# Patient Record
Sex: Female | Born: 1975 | Race: White | Hispanic: No | Marital: Married | Smoking: Former smoker
Health system: Southern US, Community
[De-identification: ages and names within clinical notes are randomized; demographics above are authoritative.]

## PROBLEM LIST (undated history)

## (undated) DIAGNOSIS — G43909 Migraine, unspecified, not intractable, without status migrainosus: Secondary | ICD-10-CM

## (undated) DIAGNOSIS — E039 Hypothyroidism, unspecified: Secondary | ICD-10-CM

## (undated) HISTORY — DX: Migraine, unspecified, not intractable, without status migrainosus: G43.909

## (undated) HISTORY — DX: Hypothyroidism, unspecified: E03.9

## (undated) HISTORY — PX: MANDIBLE SURGERY: SHX707

---

## 2002-08-23 ENCOUNTER — Other Ambulatory Visit: Admission: RE | Admit: 2002-08-23 | Discharge: 2002-08-23 | Payer: Self-pay | Admitting: Family Medicine

## 2004-01-09 ENCOUNTER — Other Ambulatory Visit: Admission: RE | Admit: 2004-01-09 | Discharge: 2004-01-09 | Payer: Self-pay | Admitting: Family Medicine

## 2005-02-14 ENCOUNTER — Other Ambulatory Visit: Admission: RE | Admit: 2005-02-14 | Discharge: 2005-02-14 | Payer: Self-pay | Admitting: Obstetrics and Gynecology

## 2005-04-24 LAB — HM MAMMOGRAPHY: HM MAMMO: NORMAL

## 2006-02-18 ENCOUNTER — Other Ambulatory Visit: Admission: RE | Admit: 2006-02-18 | Discharge: 2006-02-18 | Payer: Self-pay | Admitting: Obstetrics and Gynecology

## 2006-12-24 ENCOUNTER — Encounter: Admission: RE | Admit: 2006-12-24 | Discharge: 2007-02-09 | Payer: Self-pay | Admitting: Dentistry

## 2007-07-15 ENCOUNTER — Encounter: Payer: Self-pay | Admitting: Endocrinology

## 2007-08-12 ENCOUNTER — Other Ambulatory Visit: Admission: RE | Admit: 2007-08-12 | Discharge: 2007-08-12 | Payer: Self-pay | Admitting: Obstetrics and Gynecology

## 2007-08-25 ENCOUNTER — Ambulatory Visit (HOSPITAL_COMMUNITY): Admission: RE | Admit: 2007-08-25 | Discharge: 2007-08-25 | Payer: Self-pay | Admitting: Obstetrics and Gynecology

## 2007-09-11 ENCOUNTER — Ambulatory Visit (HOSPITAL_COMMUNITY): Admission: RE | Admit: 2007-09-11 | Discharge: 2007-09-11 | Payer: Self-pay | Admitting: Obstetrics and Gynecology

## 2007-10-05 ENCOUNTER — Ambulatory Visit (HOSPITAL_COMMUNITY): Admission: RE | Admit: 2007-10-05 | Discharge: 2007-10-05 | Payer: Self-pay | Admitting: Obstetrics and Gynecology

## 2007-10-20 ENCOUNTER — Ambulatory Visit (HOSPITAL_COMMUNITY): Admission: RE | Admit: 2007-10-20 | Discharge: 2007-10-20 | Payer: Self-pay | Admitting: Obstetrics and Gynecology

## 2008-02-19 ENCOUNTER — Inpatient Hospital Stay (HOSPITAL_COMMUNITY): Admission: AD | Admit: 2008-02-19 | Discharge: 2008-02-19 | Payer: Self-pay | Admitting: Obstetrics & Gynecology

## 2008-02-21 ENCOUNTER — Inpatient Hospital Stay (HOSPITAL_COMMUNITY): Admission: AD | Admit: 2008-02-21 | Discharge: 2008-02-23 | Payer: Self-pay | Admitting: Obstetrics and Gynecology

## 2008-10-07 LAB — HM MAMMOGRAPHY: HM MAMMO: NORMAL

## 2009-03-24 LAB — CONVERTED CEMR LAB

## 2009-03-29 ENCOUNTER — Other Ambulatory Visit: Admission: RE | Admit: 2009-03-29 | Discharge: 2009-03-29 | Payer: Self-pay | Admitting: Obstetrics and Gynecology

## 2009-03-29 ENCOUNTER — Encounter: Payer: Self-pay | Admitting: Endocrinology

## 2009-05-10 ENCOUNTER — Encounter: Payer: Self-pay | Admitting: Endocrinology

## 2009-06-06 ENCOUNTER — Ambulatory Visit: Payer: Self-pay | Admitting: Endocrinology

## 2009-06-06 DIAGNOSIS — E039 Hypothyroidism, unspecified: Secondary | ICD-10-CM | POA: Insufficient documentation

## 2009-06-06 DIAGNOSIS — E7800 Pure hypercholesterolemia, unspecified: Secondary | ICD-10-CM | POA: Insufficient documentation

## 2009-07-19 ENCOUNTER — Telehealth: Payer: Self-pay | Admitting: Endocrinology

## 2009-07-20 ENCOUNTER — Ambulatory Visit: Payer: Self-pay | Admitting: Endocrinology

## 2009-07-21 LAB — CONVERTED CEMR LAB
Cholesterol: 204 mg/dL — ABNORMAL HIGH (ref 0–200)
TSH: 6.39 microintl units/mL — ABNORMAL HIGH (ref 0.35–5.50)
Total CHOL/HDL Ratio: 2

## 2009-10-16 ENCOUNTER — Ambulatory Visit: Payer: Self-pay | Admitting: Endocrinology

## 2010-07-04 ENCOUNTER — Other Ambulatory Visit: Payer: Self-pay | Admitting: Endocrinology

## 2010-07-04 ENCOUNTER — Ambulatory Visit
Admission: RE | Admit: 2010-07-04 | Discharge: 2010-07-04 | Payer: Self-pay | Source: Home / Self Care | Attending: Endocrinology | Admitting: Endocrinology

## 2010-07-04 LAB — TSH: TSH: 1.78 u[IU]/mL (ref 0.35–5.50)

## 2010-07-24 NOTE — Progress Notes (Signed)
Summary: Lab request  Phone Note Call from Patient Call back at Home Phone [phone]   Caller: Patient Call For: Minus Breeding MD Reason for Call: Talk to Doctor Summary of Call: Patient came into the office requesting another lab to recheck her cholesterol. She said it was high last time, and would like to have it rechecked for peace of mind. Initial call taken by: Irma Newness,  July 19, 2009 11:43 AM  Follow-up for Phone Call        tsh 244.9 lipids 272.0 Follow-up by: Minus Breeding MD,  July 19, 2009 2:40 PM  Additional Follow-up for Phone Call Additional follow up Details #1::        I put labs in IDX. Tried to inform pt but phone just rang. Will try in the morning again. Additional Follow-up by: Josph Macho CMA,  July 19, 2009 3:17 PM    Additional Follow-up for Phone Call Additional follow up Details #2::    pt informed Follow-up by: Margaret Pyle, CMA,  July 19, 2009 3:53 PM

## 2010-07-26 NOTE — Assessment & Plan Note (Signed)
Summary: PER DAHLIA SCHED   STC   Vital Signs:  Patient profile:   35 year old female Height:      65 inches (165.10 cm) Weight:      125.50 pounds (57.05 kg) BMI:     20.96 O2 Sat:      99 % on Room air Temp:     98.9 degrees F (37.17 degrees C) oral Pulse rate:   88 / minute Pulse rhythm:   regular BP sitting:   106 / 78  (left arm) Cuff size:   regular  Vitals Entered By: Brenton Grills CMA Duncan Dull) (July 04, 2010 10:57 AM)  O2 Flow:  Room air CC: Follow on thyroid/refill on meds/aj Is Patient Diabetic? No Comments Pt is not currently taking Birth Control   Referring Provider:  Dallas Schimke Primary Provider:  r neal  CC:  Follow on thyroid/refill on meds/aj.  History of Present Illness: pt has h/o mild primary hypothyroidism.  pt states she feels well in general.  she says she and her husband are now considering a pregnancy.    Current Medications (verified): 1)  Birth Control 2)  Levothyroxine Sodium 50 Mcg Tabs (Levothyroxine Sodium) .Marland Kitchen.. 1 Qd  Allergies (verified): No Known Drug Allergies  Past History:  Past Medical History: HYPERCHOLESTEROLEMIA (ICD-272.0) UNSPECIFIED HYPOTHYROIDISM (ICD-244.9)  Review of Systems  The patient denies weight loss and weight gain.    Physical Exam  General:  normal appearance.   Neck:  thyroid is not enlarged.  it has a slightly irregular texture.   Impression & Recommendations:  Problem # 1:  UNSPECIFIED HYPOTHYROIDISM (ICD-244.9) well-replaced  Other Orders: TLB-TSH (Thyroid Stimulating Hormone) (84443-TSH) TLB-TSH (Thyroid Stimulating Hormone) (84443-TSH) Est. Patient Level III (16109)  Patient Instructions: 1)  cc dr neal 2)  if normal, i would advise recheck in 6 mos, then annually.  tsh should also be followed if pregnancy is diagnosed or becomes possible.  i would be happy to do this, or dr neal could do so.   3)  (update: i left message on phone-tree:  rx as we discussed) Prescriptions: LEVOTHYROXINE  SODIUM 50 MCG TABS (LEVOTHYROXINE SODIUM) 1 qd  #90 x 2   Entered and Authorized by:   Minus Breeding MD   Signed by:   Minus Breeding MD on 07/04/2010   Method used:   Electronically to        [facility] (retail)       [address]       [place], [place]  [address]       Ph: [phone]       Fax: [phone]   RxID:   [id number]    Orders Added: 1)  TLB-TSH (Thyroid Stimulating Hormone) [84443-TSH] 2)  TLB-TSH (Thyroid Stimulating Hormone) [84443-TSH] 3)  Est. Patient Level III [44010]

## 2011-01-09 ENCOUNTER — Encounter: Payer: Self-pay | Admitting: Internal Medicine

## 2011-01-09 ENCOUNTER — Ambulatory Visit (INDEPENDENT_AMBULATORY_CARE_PROVIDER_SITE_OTHER): Payer: BC Managed Care – PPO | Admitting: Internal Medicine

## 2011-01-09 DIAGNOSIS — E079 Disorder of thyroid, unspecified: Secondary | ICD-10-CM

## 2011-01-09 DIAGNOSIS — E785 Hyperlipidemia, unspecified: Secondary | ICD-10-CM

## 2011-01-09 DIAGNOSIS — R002 Palpitations: Secondary | ICD-10-CM

## 2011-01-09 DIAGNOSIS — E7800 Pure hypercholesterolemia, unspecified: Secondary | ICD-10-CM

## 2011-01-09 DIAGNOSIS — R079 Chest pain, unspecified: Secondary | ICD-10-CM

## 2011-01-09 NOTE — Assessment & Plan Note (Signed)
The patient's chest discomfort has been associated with the palpitations and so I think is a derivative. She does have a family history of heart disease. We will look at ST segment changes at the time of her treadmill testing be undertaken for provokable arrhythmia

## 2011-01-09 NOTE — Progress Notes (Signed)
  HPI  Katherine [name] is a 35 y.o. female Her only request because of palpitations.  She has a long standing history of episodic shortness of breath that has been thoroughly evaluated and has felt to be related to anxiety.He seemed to respond in the past 2 benzodiazepine therapy. It first occurred as a significant stress related to family issues.   About 3 or 4 months ago, she began having palpitations. She describes is variably as pounding or racing. They are not particularly fast. She feels that being all over. It is accompanied occasionally by shortness of breath as well as a mid sternal chest discomfort. It can last minutes to hours.  She does not use caffeine. It is not particularly associated with anxiety.  She has used her heart rate more to her and noted variability and the heart rate from 60-100. Her husband, Dr. Maple Hudson, taking her pulse has noted skips consistent with compensatory pause seen.   She is extremely fit exercising an hour 5 times a week. She has had no limitations with exercise. She does have a family history of heart disease in her father.  She denies Lightheadedness, orthostatic intolerance, or shwoer intolerance.      Past Medical History  Diagnosis Date  . Hypercholesteremia   . Unspecified hypothyroidism     Past Surgical History  Procedure Date  . Mandible surgery     Current Outpatient Prescriptions  Medication Sig Dispense Refill  . ALPRAZolam (XANAX) 0.5 MG tablet Take 0.5 mg by mouth as needed.        Marland Kitchen CALCIUM PO Take by mouth daily.        . fish oil-omega-3 fatty acids 1000 MG capsule Take by mouth daily.        Marland Kitchen FLUoxetine (PROZAC) 40 MG capsule Take 40 mg by mouth as directed.        . IRON PO Take by mouth daily.        Marland Kitchen levothyroxine (SYNTHROID, LEVOTHROID) 50 MCG tablet Take 50 mcg by mouth daily.        Marland Kitchen VITAMIN D, CHOLECALCIFEROL, PO Take by mouth daily.          No Known Allergies  Review of Systems negative except from HPI and PMH     Physical Exam Well developed and well nourished No Caucasian female appearing her stated agein no acute distress HENT normal E scleral and icterus clear Neck Supple JVP flat; carotids brisk and full Clear to ausculation Regular rate and rhythm, no murmurs gallops or rub Soft with active bowel sounds No clubbing cyanosis and edema Alert and oriented, grossly normal motor and sensory function Skin Warm and Dry  ECG Sinus rhythm at 58 Intervals 0.13/0.08/0.43 No evidence of ventricular preexcitation. Otherwise normal  Assessment and  Plan

## 2011-01-09 NOTE — Patient Instructions (Signed)
Your physician recommends that you schedule a follow-up appointment in: will be determined on the results on your monitor  Your physician recommends that you return for lab work in: this week --  Lipid, Liver, BMP, CBC, TSH, Mag  Your physician has requested that you have an exercise tolerance test. For further information please visit [email or web address] Please also follow instruction sheet, as given.

## 2011-01-09 NOTE — Assessment & Plan Note (Addendum)
Her palpitations sound as if they are ectopic beats. I would suspect PVCs. There is no evidence of structural heart disease from her Electro- cardiogram. In the event that Per PVC morphology, presuming that so we find, is not consistent with an RVOT origin, further morphological evaluation would be appropriate. In addition, we will undertake stress testing to see if we can provoke the arrhythmia.  Further we will use an event recorder to try to clarify the mechanism of her palpitations.

## 2011-01-09 NOTE — Assessment & Plan Note (Signed)
Will check a fasting lipid profile

## 2011-01-15 ENCOUNTER — Other Ambulatory Visit (INDEPENDENT_AMBULATORY_CARE_PROVIDER_SITE_OTHER): Payer: BC Managed Care – PPO | Admitting: *Deleted

## 2011-01-15 ENCOUNTER — Other Ambulatory Visit: Payer: Self-pay | Admitting: Internal Medicine

## 2011-01-15 DIAGNOSIS — E079 Disorder of thyroid, unspecified: Secondary | ICD-10-CM

## 2011-01-15 DIAGNOSIS — R079 Chest pain, unspecified: Secondary | ICD-10-CM

## 2011-01-15 DIAGNOSIS — E785 Hyperlipidemia, unspecified: Secondary | ICD-10-CM

## 2011-01-15 LAB — LIPID PANEL
Cholesterol: 260 mg/dL — ABNORMAL HIGH (ref 0–200)
HDL: 95.3 mg/dL (ref >39.00)
Total CHOL/HDL Ratio: 3
Triglycerides: 44 mg/dL (ref 0.0–149.0)
VLDL: 8.8 mg/dL (ref 0.0–40.0)

## 2011-01-15 LAB — TSH: TSH: 3.58 u[IU]/mL (ref 0.35–5.50)

## 2011-01-15 LAB — CBC WITH DIFFERENTIAL/PLATELET
Basophils Absolute: 0 10*3/uL (ref 0.0–0.1)
HCT: 41.3 % (ref 36.0–46.0)
Lymphs Abs: 1.5 10*3/uL (ref 0.7–4.0)
Monocytes Absolute: 0.4 10*3/uL (ref 0.1–1.0)
Monocytes Relative: 7.1 % (ref 3.0–12.0)
Neutrophils Relative %: 60.5 % (ref 43.0–77.0)
Platelets: 184 10*3/uL (ref 150.0–400.0)
RDW: 13 % (ref 11.5–14.6)

## 2011-01-15 LAB — BASIC METABOLIC PANEL
BUN: 14 mg/dL (ref 6–23)
Creatinine, Ser: 0.7 mg/dL (ref 0.4–1.2)
GFR: 99.78 mL/min (ref >60.00)
Potassium: 4 mEq/L (ref 3.5–5.1)

## 2011-01-15 LAB — LDL CHOLESTEROL, DIRECT: Direct LDL: 145.6 mg/dL

## 2011-01-15 LAB — HEPATIC FUNCTION PANEL: Total Bilirubin: 0.6 mg/dL (ref 0.3–1.2)

## 2011-01-17 ENCOUNTER — Ambulatory Visit: Payer: Self-pay | Admitting: Cardiovascular Disease

## 2011-01-21 ENCOUNTER — Telehealth: Payer: Self-pay | Admitting: Internal Medicine

## 2011-01-21 ENCOUNTER — Encounter (INDEPENDENT_AMBULATORY_CARE_PROVIDER_SITE_OTHER): Payer: BC Managed Care – PPO

## 2011-01-21 DIAGNOSIS — R55 Syncope and collapse: Secondary | ICD-10-CM

## 2011-01-21 DIAGNOSIS — R002 Palpitations: Secondary | ICD-10-CM

## 2011-01-21 NOTE — Telephone Encounter (Signed)
Order for event.

## 2011-01-21 NOTE — Telephone Encounter (Signed)
I spoke with Dr. Graciela Husbands, he states that since the patient is having this many symptoms, that we should go ahead and put a [employer] monitor on the patient. I spoke with Windell Moulding and she can put this on her today at 4:00pm. I have called the patient and made her aware of this and she is agreeable. We will proceed with her GXT as scheduled on 01/23/11.

## 2011-01-21 NOTE — Telephone Encounter (Signed)
Per pt hubsand calling wife C/O having same symptoms again. Faint spells.  Pulse up. Blood pressure this am 14?/90.

## 2011-01-21 NOTE — Telephone Encounter (Signed)
I spoke with the patient and her husband this morning. She states that when she woke up this morning, she was not feeling well. She feels like she's been "drugged." She has not passed out, but does feel somewhat pre-syncopal. She states that she did not check her vitals this morning, but after she dropped her son off at daycare, she states her BP was 131/90 and her HR was around 115. It has since been down in the 70-90 range, but she states this is not typical for her. Her BP per her husband is usually around 110/60. She does not have CP or hard thumping in her chest right now. She is scheduled for a GXT on 01/23/11 with Dr. Graciela Husbands. She is not wearing an event monitor that was ordered by Dr. Graciela Husbands, I am uncertain if he is waiting until after her GXT for this. She states it was a special type of monitor. I explained I will review with Dr. Graciela Husbands what is going on, and see what type of monitor he wants for her, then I will call her back. She is agreeable.

## 2011-01-23 ENCOUNTER — Institutional Professional Consult (permissible substitution): Payer: Self-pay | Admitting: Internal Medicine

## 2011-01-23 ENCOUNTER — Institutional Professional Consult (permissible substitution): Payer: Self-pay | Admitting: Cardiology

## 2011-01-23 ENCOUNTER — Ambulatory Visit (INDEPENDENT_AMBULATORY_CARE_PROVIDER_SITE_OTHER): Payer: BC Managed Care – PPO | Admitting: Internal Medicine

## 2011-01-23 DIAGNOSIS — R002 Palpitations: Secondary | ICD-10-CM

## 2011-01-23 DIAGNOSIS — R079 Chest pain, unspecified: Secondary | ICD-10-CM

## 2011-01-23 NOTE — Progress Notes (Signed)
Exercise Treadmill Test  Pre-Exercise Testing Evaluation Rhythm: normal sinus  Rate: 67   PR:  .15 QRS:  .07  QT:  .40 QTc: .42     Test  Exercise Tolerance Test Ordering MD: Sherryl Manges, MD  Interpreting MD:  Sherryl Manges, MD  Unique Test No: 1  Treadmill:  1  Indication for ETT: chest pain - rule out ischemia  Contraindication to ETT: No   Stress Modality: exercise - treadmill  Cardiac Imaging Performed: non   Protocol: standard Bruce - submaximal  Max BP: 180/98  Max MPHR (bpm):  183 85% MPR (bpm):  155  MPHR obtained (bpm):166 % MPHR obtained: 92  Reached 85% MPHR (min:sec):  166 Total Exercise Time (min-sec):  9:19  Workload in METS:  17.2 Borg Scale:   Reason ETT Terminated:  hypotension (>72mm drop SBP)    ST Segment Analysis At Rest: normal ST segments - no evidence of significant ST depression With Exercise: no evidence of significant ST depression  Other Information Arrhythmia:  Yes Angina during ETT:  absent (0) Quality of ETT:  diagnostic  ETT Interpretation:  normal - no evidence of ischemia by ST analysis  Comments: No exercise induced arrhythmia or st changes Significant fall in blood pressure with exercise   Recommendations: 2 d echocardiogram

## 2011-01-23 NOTE — Progress Notes (Signed)
Addended by: Sherri Rad C on: 01/23/2011 02:56 PM   Modules accepted: Orders

## 2011-01-24 ENCOUNTER — Encounter: Payer: Self-pay | Admitting: *Deleted

## 2011-01-25 ENCOUNTER — Ambulatory Visit (HOSPITAL_COMMUNITY): Payer: BC Managed Care – PPO | Attending: Internal Medicine | Admitting: Radiology

## 2011-01-25 DIAGNOSIS — E785 Hyperlipidemia, unspecified: Secondary | ICD-10-CM | POA: Insufficient documentation

## 2011-01-25 DIAGNOSIS — R072 Precordial pain: Secondary | ICD-10-CM

## 2011-01-25 DIAGNOSIS — R0609 Other forms of dyspnea: Secondary | ICD-10-CM | POA: Insufficient documentation

## 2011-01-25 DIAGNOSIS — R079 Chest pain, unspecified: Secondary | ICD-10-CM | POA: Insufficient documentation

## 2011-01-25 DIAGNOSIS — R002 Palpitations: Secondary | ICD-10-CM

## 2011-01-25 DIAGNOSIS — R0989 Other specified symptoms and signs involving the circulatory and respiratory systems: Secondary | ICD-10-CM

## 2011-02-12 ENCOUNTER — Encounter: Payer: Self-pay | Admitting: Internal Medicine

## 2011-03-20 ENCOUNTER — Other Ambulatory Visit: Payer: Self-pay | Admitting: Endocrinology

## 2011-03-26 ENCOUNTER — Telehealth: Payer: Self-pay | Admitting: Internal Medicine

## 2011-03-26 DIAGNOSIS — G901 Familial dysautonomia [Riley-Day]: Secondary | ICD-10-CM

## 2011-03-26 NOTE — Telephone Encounter (Signed)
I spoke with Dr. Graciela Husbands about this. He will call Dr. Maple Hudson. The contact number for Dr. Maple Hudson was sent to Dr. Graciela Husbands.

## 2011-03-26 NOTE — Telephone Encounter (Signed)
Test result

## 2011-03-27 LAB — CBC
HCT: 33.2 — ABNORMAL LOW
Hemoglobin: 11.3 — ABNORMAL LOW
WBC: 11.3 — ABNORMAL HIGH

## 2011-03-27 LAB — URINALYSIS, ROUTINE W REFLEX MICROSCOPIC
Bilirubin Urine: NEGATIVE
Specific Gravity, Urine: <1.005 — ABNORMAL LOW
pH: 6

## 2011-03-27 LAB — URINE MICROSCOPIC-ADD ON

## 2011-03-28 NOTE — Telephone Encounter (Signed)
Per Dr. Graciela Husbands, he left a message for Dr. Maple Hudson last night that he would try to be in touch with him later today.

## 2011-03-29 ENCOUNTER — Telehealth: Payer: Self-pay | Admitting: Internal Medicine

## 2011-03-29 NOTE — Telephone Encounter (Signed)
i spoke w pts husband and related the following  The p wave morphology of all the beats were similar, that there were some abrupt transitions of HR of about 30 beats both speeding up and slowing down suggestive of an ectopic focus, but these transitions did not seem to be the trigger for her symptoms which correlated rather with the HR 100-110 or so.  Given teh results of the GXT with exercise assoc hypotension attirbuted in the setting of anormal echo to dysautonomia, that is probably the best unifying diagnosis

## 2011-04-01 ENCOUNTER — Telehealth: Payer: Self-pay | Admitting: Internal Medicine

## 2011-04-01 NOTE — Telephone Encounter (Signed)
Pt called. Pt wants a beta blocker to be called in to [facility] [address]

## 2011-04-02 ENCOUNTER — Encounter: Payer: Self-pay | Admitting: *Deleted

## 2011-04-02 MED ORDER — METOPROLOL SUCCINATE ER 25 MG PO TB24: 25.0000 mg | ORAL_TABLET | Freq: Every day | ORAL | Status: DC

## 2011-04-02 MED ORDER — METOPROLOL TARTRATE 25 MG PO TABS: 25.0000 mg | ORAL_TABLET | Freq: Two times a day (BID) | ORAL | Status: DC

## 2011-04-02 MED ORDER — ATENOLOL 25 MG PO TABS: 25.0000 mg | ORAL_TABLET | Freq: Every day | ORAL | Status: DC

## 2011-04-02 NOTE — Telephone Encounter (Signed)
Prescriptions are being mailed to the patient for the three beta blockers prescribed by Dr. Graciela Husbands.

## 2011-04-02 NOTE — Telephone Encounter (Signed)
i spoke with the pt and her husband on two diffenernt occasions highlighting that there were symptoms associated with what appears to be sinus tach although there were some relatively abrupt transitions .  Given the abnormaliteis noted on the stress with exercise assoc hypotension, the most likely explanation is dysautonomia  We discussed a low salt repletion. They will consider beta blockers. In the event that they would like to do that we will start a low dose is given a prescription for metoprolol tartrate 25 b.i.d., atenolol 25 mg, and metoprolol succinate 25 mg daily. She will try with a half dose initially to look for side effects.

## 2011-04-08 NOTE — Telephone Encounter (Signed)
Patient was made aware that three prescriptions were mail on 04/02/11 by Sherri Rad RN. Atenolol 25 mg, Metoprolol 25 mg daily, and Metoprolol tartrate 25 mg twice a day and possible instructions of how to take medications. Patient verbalized understanding.

## 2011-04-08 NOTE — Telephone Encounter (Signed)
Pt calling about rx she called about last week

## 2011-09-20 ENCOUNTER — Other Ambulatory Visit: Payer: Self-pay | Admitting: Endocrinology

## 2011-11-19 ENCOUNTER — Other Ambulatory Visit (HOSPITAL_COMMUNITY): Payer: Self-pay | Admitting: Obstetrics and Gynecology

## 2011-11-19 DIAGNOSIS — G930 Cerebral cysts: Secondary | ICD-10-CM

## 2011-12-20 ENCOUNTER — Ambulatory Visit (HOSPITAL_COMMUNITY)
Admission: RE | Admit: 2011-12-20 | Discharge: 2011-12-20 | Disposition: A | Discharge status: Home / Self Care | Payer: BC Managed Care – PPO | Source: Ambulatory Visit | Attending: Obstetrics and Gynecology | Admitting: Obstetrics and Gynecology

## 2011-12-20 ENCOUNTER — Encounter (HOSPITAL_COMMUNITY): Payer: Self-pay

## 2011-12-20 DIAGNOSIS — Z3689 Encounter for other specified antenatal screening: Secondary | ICD-10-CM | POA: Insufficient documentation

## 2011-12-20 DIAGNOSIS — O350XX0 Maternal care for (suspected) central nervous system malformation in fetus, not applicable or unspecified: Secondary | ICD-10-CM | POA: Insufficient documentation

## 2011-12-20 DIAGNOSIS — O3500X0 Maternal care for (suspected) central nervous system malformation or damage in fetus, unspecified, not applicable or unspecified: Secondary | ICD-10-CM | POA: Insufficient documentation

## 2011-12-20 DIAGNOSIS — G930 Cerebral cysts: Secondary | ICD-10-CM

## 2013-02-22 LAB — HM PAP SMEAR: HM Pap smear: NORMAL

## 2013-02-25 ENCOUNTER — Telehealth: Payer: Self-pay | Admitting: Endocrinology

## 2013-02-25 NOTE — Telephone Encounter (Signed)
Can pt come in and get labs drawn for thyroid, did not want to sch appt w/ Dr. Everardo All, just needs labs at this time. Please call-Thanks! Sherri

## 2013-02-25 NOTE — Telephone Encounter (Signed)
This pt has not been seen in years.  We cannot rx over the phone. Please advise ov

## 2013-02-25 NOTE — Telephone Encounter (Signed)
Pt advised appt made for 03/04/13 at 10:00

## 2013-03-04 ENCOUNTER — Ambulatory Visit: Payer: BC Managed Care – PPO | Admitting: Endocrinology

## 2013-03-11 ENCOUNTER — Ambulatory Visit (INDEPENDENT_AMBULATORY_CARE_PROVIDER_SITE_OTHER): Payer: BC Managed Care – PPO | Admitting: Endocrinology

## 2013-03-11 ENCOUNTER — Encounter: Payer: Self-pay | Admitting: Endocrinology

## 2013-03-11 VITALS — BP 122/80 | HR 99 | Ht 64.0 in | Wt 130.0 lb

## 2013-03-11 DIAGNOSIS — E039 Hypothyroidism, unspecified: Secondary | ICD-10-CM

## 2013-03-11 LAB — TSH: TSH: 3.48 u[IU]/mL (ref 0.35–5.50)

## 2013-03-11 LAB — T4, FREE: Free T4: 0.64 ng/dL (ref 0.60–1.60)

## 2013-03-11 NOTE — Progress Notes (Signed)
  Subjective:    Patient ID: Katherine [name], female    DOB: 03/29/1976, 37 y.o.   MRN: [id number]  HPI pt states she was noted prior to a 2009 pregnancy, to have a slightly elevated tsh.  She has been on synthroid since then.  She is now 1 year postpartum.  She is not considering another pregnancy.  She has had several adjustments of her synthroid.  She is now on her current 10 mcg/day, x 6 weeks.  pt states she feels well in general.   Past Medical History  Diagnosis Date  . Hypercholesteremia   . Unspecified hypothyroidism     Past Surgical History  Procedure Laterality Date  . Mandible surgery      History   Social History  . Marital Status: Married    Spouse Name: N/A    Number of Children: 1  . Years of Education: N/A   Occupational History  .     Social History Main Topics  . Smoking status: Former Games developer  . Smokeless tobacco: Never Used  . Alcohol Use: Yes     Comment: occasionally wine  . Drug Use: No  . Sexual Activity: Not on file   Other Topics Concern  . Not on file   Social History Narrative  . No narrative on file    Current Outpatient Prescriptions on File Prior to Visit  Medication Sig Dispense Refill  . CALCIUM PO Take by mouth daily.        . fish oil-omega-3 fatty acids 1000 MG capsule Take by mouth daily.        . IRON PO Take by mouth daily.        Marland Kitchen VITAMIN D, CHOLECALCIFEROL, PO Take by mouth daily.         No current facility-administered medications on file prior to visit.    No Known Allergies  Family History  Problem Relation Age of Onset  . Cancer    . Hypertension    . Hyperlipidemia    . Heart attack    . Stroke      BP 122/80  Pulse 99  Ht 5\' 4"  (1.626 m)  Wt 130 lb (58.968 kg)  BMI 22.3 kg/m2  SpO2 97%  Review of Systems denies weight change    Objective:   Physical Exam VITAL SIGNS:  See vs page GENERAL: no distress NECK: thyroid is slightly enlarged on the right, and normal on the left.   Lab Results   Component Value Date   TSH 3.48 03/11/2013      Assessment & Plan:  Hypothyroidism: well-replaced

## 2013-03-11 NOTE — Patient Instructions (Signed)
blood tests are being requested for you today.  We'll contact you with results.   Please return in 1 year.   

## 2013-05-09 ENCOUNTER — Telehealth: Payer: Self-pay | Admitting: Endocrinology

## 2013-05-09 NOTE — Telephone Encounter (Signed)
please call patient: i have received your recent labs. Has your medication been adjusted for this?

## 2013-05-10 NOTE — Telephone Encounter (Signed)
Dr Jennette Kettle sent [place] results. These results say we need to increase your thyroid medication. It is probably not abnormal enough for you to notice. i would be happy to send rx.

## 2013-05-10 NOTE — Telephone Encounter (Signed)
Called pt and advised that Dr Everardo All had received your recent labs. He wanted to know has your medication been adjusted for this? Pt stated no. She says she is feeling fine and wonders if this is a lab error and needs to be done again, before adjusting her meds? Please advise.

## 2013-05-10 NOTE — Telephone Encounter (Signed)
Called pt and lvm that Dr Jennette Kettle sent [place] results. Dr Everardo All said that the results say we need to increase your thyroid medication. It is probably not abnormal enough for you to notice. He will be happy to send an rx. Advised pt to call us back and let us know.

## 2013-05-18 ENCOUNTER — Telehealth: Payer: Self-pay | Admitting: *Deleted

## 2013-05-18 MED ORDER — LEVOTHYROXINE SODIUM 112 MCG PO TABS
112.0000 ug | ORAL_TABLET | Freq: Every day | ORAL | Status: DC
Start: 2013-05-18 — End: 2014-04-29

## 2013-05-18 NOTE — Telephone Encounter (Signed)
Pt is aware of increased rx.

## 2013-05-18 NOTE — Telephone Encounter (Signed)
Pts husband called and said they have been trying for 2 weeks to get a call back about an increase in his wife's synthroid increase refilled, He spoke with Marica Otter # is [phone]

## 2013-05-18 NOTE — Telephone Encounter (Signed)
On synthroid 100 mcg/day, our blood test was normal.  However, dr neal found the test slightly off, on the same dosage.  According to epic, we offered to increase the synthroid, and are waiting to hear back.  i have sent a prescription to your pharmacy, to increase.

## 2013-05-18 NOTE — Telephone Encounter (Signed)
Her husband, Dr. Maple Hudson, phoned in wanting to know why her prescription had not been called into pharmacy----according to viewable documentation, she never called [place] asking.  However, your latest statement was to increase her synthroid.  She is presently on 100 mcg...is this the dose you wish to have her on or increased?   Please advise  Pt callback number [phone]

## 2013-10-06 ENCOUNTER — Telehealth: Payer: Self-pay

## 2013-10-06 NOTE — Telephone Encounter (Signed)
Spoke with patient's husband, Wayland Salinasdgar Botsford.  According to husband, patient was "not up to coming to the phone," but stated that she will make her appointment with Dr. Beverely Lowabori tomorrow at 1030.    Appointment confirmed.

## 2013-10-07 ENCOUNTER — Encounter: Payer: Self-pay | Admitting: General Practice

## 2013-10-07 ENCOUNTER — Encounter: Payer: Self-pay | Admitting: Family Medicine

## 2013-10-07 ENCOUNTER — Ambulatory Visit (INDEPENDENT_AMBULATORY_CARE_PROVIDER_SITE_OTHER): Payer: BC Managed Care – PPO | Admitting: Family Medicine

## 2013-10-07 VITALS — BP 102/70 | HR 72 | Temp 98.2°F | Resp 16 | Ht 65.0 in | Wt 121.2 lb

## 2013-10-07 DIAGNOSIS — E039 Hypothyroidism, unspecified: Secondary | ICD-10-CM

## 2013-10-07 DIAGNOSIS — F3289 Other specified depressive episodes: Secondary | ICD-10-CM

## 2013-10-07 DIAGNOSIS — G43909 Migraine, unspecified, not intractable, without status migrainosus: Secondary | ICD-10-CM

## 2013-10-07 DIAGNOSIS — F329 Major depressive disorder, single episode, unspecified: Secondary | ICD-10-CM

## 2013-10-07 DIAGNOSIS — L609 Nail disorder, unspecified: Secondary | ICD-10-CM

## 2013-10-07 DIAGNOSIS — F32A Depression, unspecified: Secondary | ICD-10-CM | POA: Insufficient documentation

## 2013-10-07 LAB — LIPID PANEL
CHOL/HDL RATIO: 3
Cholesterol: 234 mg/dL — ABNORMAL HIGH (ref 0–200)
HDL: 77.5 mg/dL (ref >39.00)
LDL CALC: 141 mg/dL — AB (ref 0–99)
TRIGLYCERIDES: 77 mg/dL (ref 0.0–149.0)
VLDL: 15.4 mg/dL (ref 0.0–40.0)

## 2013-10-07 LAB — CBC WITH DIFFERENTIAL/PLATELET
BASOS PCT: 0.7 % (ref 0.0–3.0)
Basophils Absolute: 0 10*3/uL (ref 0.0–0.1)
EOS ABS: 0.2 10*3/uL (ref 0.0–0.7)
Eosinophils Relative: 4.9 % (ref 0.0–5.0)
HEMATOCRIT: 38.1 % (ref 36.0–46.0)
HEMOGLOBIN: 12.7 g/dL (ref 12.0–15.0)
LYMPHS ABS: 1.2 10*3/uL (ref 0.7–4.0)
Lymphocytes Relative: 27.1 % (ref 12.0–46.0)
MCHC: 33.3 g/dL (ref 30.0–36.0)
MCV: 93 fl (ref 78.0–100.0)
MONO ABS: 0.3 10*3/uL (ref 0.1–1.0)
Monocytes Relative: 6.3 % (ref 3.0–12.0)
NEUTROS ABS: 2.7 10*3/uL (ref 1.4–7.7)
Neutrophils Relative %: 61 % (ref 43.0–77.0)
Platelets: 242 10*3/uL (ref 150.0–400.0)
RBC: 4.1 Mil/uL (ref 3.87–5.11)
RDW: 13.6 % (ref 11.5–14.6)
WBC: 4.4 10*3/uL — ABNORMAL LOW (ref 4.5–10.5)

## 2013-10-07 LAB — HEPATIC FUNCTION PANEL
ALT: 19 U/L (ref 0–35)
AST: 24 U/L (ref 0–37)
Albumin: 3.9 g/dL (ref 3.5–5.2)
Alkaline Phosphatase: 26 U/L — ABNORMAL LOW (ref 39–117)
BILIRUBIN DIRECT: 0 mg/dL (ref 0.0–0.3)
BILIRUBIN TOTAL: 0.5 mg/dL (ref 0.3–1.2)
Total Protein: 6.5 g/dL (ref 6.0–8.3)

## 2013-10-07 LAB — BASIC METABOLIC PANEL
BUN: 13 mg/dL (ref 6–23)
CALCIUM: 9.2 mg/dL (ref 8.4–10.5)
CHLORIDE: 105 meq/L (ref 96–112)
CO2: 27 mEq/L (ref 19–32)
CREATININE: 0.9 mg/dL (ref 0.4–1.2)
GFR: 72.67 mL/min (ref >60.00)
Glucose, Bld: 85 mg/dL (ref 70–99)
Potassium: 3.9 mEq/L (ref 3.5–5.1)
Sodium: 137 mEq/L (ref 135–145)

## 2013-10-07 LAB — TSH: TSH: 3.2 u[IU]/mL (ref 0.35–5.50)

## 2013-10-07 NOTE — Progress Notes (Signed)
Pre visit review using our clinic review tool, if applicable. No additional management support is needed unless otherwise documented below in the visit note. 

## 2013-10-07 NOTE — Assessment & Plan Note (Signed)
New to provider, ongoing for pt.  Following w/ Neuro Catalina Lunger([place]).  Currently asymptomatic.  Will follow along.

## 2013-10-07 NOTE — Patient Instructions (Signed)
Schedule your complete physical in 6 months We'll call you with your dermatology appt We'll notify you of your lab results and make any changes if needed Keep up the good work!  You look great! Welcome!  We're glad to have you!!!

## 2013-10-07 NOTE — Assessment & Plan Note (Signed)
New to provider, ongoing for pt.  Refer to Derm.  Will follow.

## 2013-10-07 NOTE — Assessment & Plan Note (Signed)
New to provider, ongoing for pt.  Following w/ Dr Haywood Lassoaudle.  Pt feels sxs are well controlled.  Will continue to follow.

## 2013-10-07 NOTE — Progress Notes (Signed)
   Subjective:    Patient ID: Katherine [name], female    DOB: 05/12/1976, 38 y.o.   MRN: [id number]  HPI New to establish.  Previous MD- none.  GYN- Katherine [name].  EndoEverardo All- [place].  Migraines- chronic problem, has been seeing Dr Katherine [name]().  On topamax for prophylaxis, maxalt as needed, fioricet prn.  Depression- chronic problem, seeing Dr Katherine [name].  Currently taking Zoloft, Topamax, Vyvanse.  Has seen a therapist in the past- has appt next week.  Feels sxs are well controlled.  Hypothyroid- dx'd post partum 5 yrs ago.  On synthroid daily.  Following w/ Dr Katherine [name].  TSH levels have been difficult to regulate due to pregnancy and nursing.  Pt feels that this is starting to level off.  Denies excessive fatigue, no palpitations, changes to skin/hair/nails, constipation.  Nail ridges- toenails, exercises regularly.  Isn't sure if ridges are from trauma or underlying issue.  Has had this issue for 'years'.   Review of Systems For ROS see HPI     Objective:   Physical Exam  Vitals reviewed. Constitutional: She is oriented to person, place, and time. She appears well-developed and well-nourished. No distress.  HENT:  Head: Normocephalic and atraumatic.  Eyes: Conjunctivae and EOM are normal. Pupils are equal, round, and reactive to light.  Neck: Normal range of motion. Neck supple. No thyromegaly present.  Cardiovascular: Normal rate, regular rhythm, normal heart sounds and intact distal pulses.   No murmur heard. Pulmonary/Chest: Effort normal and breath sounds normal. No respiratory distress.  Abdominal: Soft. She exhibits no distension. There is no tenderness.  Musculoskeletal: She exhibits no edema.  Lymphadenopathy:    She has no cervical adenopathy.  Neurological: She is alert and oriented to person, place, and time.  Skin: Skin is warm and dry.  Longitudinal ridges in great toe nails bilaterally  Psychiatric: She has a normal mood and affect. Her behavior is normal.            Assessment & Plan:

## 2013-10-07 NOTE — Assessment & Plan Note (Signed)
New to provider, ongoing for pt.  Check TSH.  Adjust meds prn.

## 2013-10-13 ENCOUNTER — Ambulatory Visit: Payer: Self-pay

## 2014-03-10 ENCOUNTER — Ambulatory Visit: Payer: BC Managed Care – PPO | Admitting: Endocrinology

## 2014-04-12 ENCOUNTER — Encounter: Payer: BC Managed Care – PPO | Admitting: Family Medicine

## 2014-04-25 ENCOUNTER — Encounter: Payer: Self-pay | Admitting: Family Medicine

## 2014-04-29 ENCOUNTER — Other Ambulatory Visit: Payer: Self-pay | Admitting: *Deleted

## 2014-04-29 MED ORDER — LEVOTHYROXINE SODIUM 112 MCG PO TABS: 112.0000 ug | ORAL_TABLET | Freq: Every day | ORAL | Status: DC

## 2014-06-08 ENCOUNTER — Ambulatory Visit (INDEPENDENT_AMBULATORY_CARE_PROVIDER_SITE_OTHER): Payer: BC Managed Care – PPO | Admitting: General Practice

## 2014-06-08 ENCOUNTER — Encounter: Payer: Self-pay | Admitting: Family Medicine

## 2014-06-08 ENCOUNTER — Ambulatory Visit (INDEPENDENT_AMBULATORY_CARE_PROVIDER_SITE_OTHER): Payer: BC Managed Care – PPO | Admitting: Family Medicine

## 2014-06-08 VITALS — BP 102/70 | HR 72 | Temp 97.7°F | Resp 16 | Ht 60.0 in | Wt 114.0 lb

## 2014-06-08 DIAGNOSIS — Z0000 Encounter for general adult medical examination without abnormal findings: Secondary | ICD-10-CM | POA: Insufficient documentation

## 2014-06-08 DIAGNOSIS — Z23 Encounter for immunization: Secondary | ICD-10-CM

## 2014-06-08 LAB — CBC WITH DIFFERENTIAL/PLATELET
Basophils Absolute: 0 10*3/uL (ref 0.0–0.1)
Basophils Relative: 0.3 % (ref 0.0–3.0)
EOS PCT: 2.8 % (ref 0.0–5.0)
Eosinophils Absolute: 0.2 10*3/uL (ref 0.0–0.7)
HEMATOCRIT: 39.2 % (ref 36.0–46.0)
HEMOGLOBIN: 12.9 g/dL (ref 12.0–15.0)
LYMPHS ABS: 1.4 10*3/uL (ref 0.7–4.0)
Lymphocytes Relative: 22.8 % (ref 12.0–46.0)
MCHC: 33 g/dL (ref 30.0–36.0)
MCV: 93.5 fl (ref 78.0–100.0)
MONO ABS: 0.4 10*3/uL (ref 0.1–1.0)
MONOS PCT: 6.2 % (ref 3.0–12.0)
NEUTROS ABS: 4.2 10*3/uL (ref 1.4–7.7)
Neutrophils Relative %: 67.9 % (ref 43.0–77.0)
PLATELETS: 232 10*3/uL (ref 150.0–400.0)
RBC: 4.19 Mil/uL (ref 3.87–5.11)
RDW: 13.6 % (ref 11.5–15.5)
WBC: 6.2 10*3/uL (ref 4.0–10.5)

## 2014-06-08 LAB — LIPID PANEL
CHOL/HDL RATIO: 3
Cholesterol: 223 mg/dL — ABNORMAL HIGH (ref 0–200)
HDL: 78.2 mg/dL (ref >39.00)
LDL CALC: 129 mg/dL — AB (ref 0–99)
NONHDL: 144.8
Triglycerides: 80 mg/dL (ref 0.0–149.0)
VLDL: 16 mg/dL (ref 0.0–40.0)

## 2014-06-08 LAB — BASIC METABOLIC PANEL
BUN: 19 mg/dL (ref 6–23)
CHLORIDE: 109 meq/L (ref 96–112)
CO2: 22 mEq/L (ref 19–32)
CREATININE: 0.8 mg/dL (ref 0.4–1.2)
Calcium: 8.6 mg/dL (ref 8.4–10.5)
GFR: 89.16 mL/min (ref >60.00)
Glucose, Bld: 81 mg/dL (ref 70–99)
POTASSIUM: 4 meq/L (ref 3.5–5.1)
Sodium: 138 mEq/L (ref 135–145)

## 2014-06-08 LAB — TSH: TSH: 3.36 u[IU]/mL (ref 0.35–4.50)

## 2014-06-08 LAB — HEPATIC FUNCTION PANEL
ALK PHOS: 29 U/L — AB (ref 39–117)
ALT: 18 U/L (ref 0–35)
AST: 22 U/L (ref 0–37)
Albumin: 4 g/dL (ref 3.5–5.2)
BILIRUBIN TOTAL: 0.6 mg/dL (ref 0.2–1.2)
Bilirubin, Direct: 0 mg/dL (ref 0.0–0.3)
Total Protein: 6.6 g/dL (ref 6.0–8.3)

## 2014-06-08 LAB — VITAMIN D 25 HYDROXY (VIT D DEFICIENCY, FRACTURES): VITD: 44.77 ng/mL (ref 30.00–100.00)

## 2014-06-08 MED ORDER — RIZATRIPTAN BENZOATE 10 MG PO TABS: 10.0000 mg | ORAL_TABLET | ORAL | Status: DC | PRN

## 2014-06-08 MED ORDER — DOXYCYCLINE HYCLATE 100 MG PO TABS: 100.0000 mg | ORAL_TABLET | Freq: Every day | ORAL | Status: DC

## 2014-06-08 NOTE — Progress Notes (Signed)
   Subjective:    Patient ID: Katherine [name], female    DOB: 12/11/1975, 38 y.o.   MRN: [id number]  HPI CPE- UTD on GYN (Dr Jennette KettleNeal).  Pt is traveling to [place] in Feb.  Needs Malaria meds and Hep A vaccine.   Review of Systems Patient reports no vision/ hearing changes, adenopathy,fever, weight change,  persistant/recurrent hoarseness , swallowing issues, chest pain, palpitations, edema, persistant/recurrent cough, hemoptysis, dyspnea (rest/exertional/paroxysmal nocturnal), gastrointestinal bleeding (melena, rectal bleeding), abdominal pain, significant heartburn, bowel changes, GU symptoms (dysuria, hematuria, incontinence), Gyn symptoms (abnormal  bleeding, pain),  syncope, focal weakness, memory loss, numbness & tingling, skin/hair/nail changes, abnormal bruising or bleeding, anxiety, or depression.     Objective:   Physical Exam General Appearance:    Alert, cooperative, no distress, appears stated age  Head:    Normocephalic, without obvious abnormality, atraumatic  Eyes:    PERRL, conjunctiva/corneas clear, EOM's intact, fundi    benign, both eyes  Ears:    Normal TM's and external ear canals, both ears  Nose:   Nares normal, septum midline, mucosa normal, no drainage    or sinus tenderness  Throat:   Lips, mucosa, and tongue normal; teeth and gums normal  Neck:   Supple, symmetrical, trachea midline, no adenopathy;    Thyroid: no enlargement/tenderness/nodules  Back:     Symmetric, no curvature, ROM normal, no CVA tenderness  Lungs:     Clear to auscultation bilaterally, respirations unlabored  Chest Wall:    No tenderness or deformity   Heart:    Regular rate and rhythm, S1 and S2 normal, no murmur, rub   or gallop  Breast Exam:    Deferred to GYN  Abdomen:     Soft, non-tender, bowel sounds active all four quadrants,    no masses, no organomegaly  Genitalia:    Deferred to GYN  Rectal:    Extremities:   Extremities normal, atraumatic, no cyanosis or edema  Pulses:   2+ and  symmetric all extremities  Skin:   Skin color, texture, turgor normal, no rashes or lesions  Lymph nodes:   Cervical, supraclavicular, and axillary nodes normal  Neurologic:   CNII-XII intact, normal strength, sensation and reflexes    throughout          Assessment & Plan:

## 2014-06-08 NOTE — Progress Notes (Signed)
Pre visit review using our clinic review tool, if applicable. No additional management support is needed unless otherwise documented below in the visit note. 

## 2014-06-08 NOTE — Patient Instructions (Signed)
Schedule a nurse visit in 6 months for your 2nd Hep A Follow up in 1 year or as needed We'll notify you of your lab results and make any changes if needed Keep up the good work!  You look great! Call with any questions or concerns Start the Doxycycline once daily- the day before you leave- and extending 4 weeks after you return (complete all 45 pills) Call with any questions or concerns Happy Holidays! Safe, fun travels!!

## 2014-06-10 ENCOUNTER — Ambulatory Visit: Payer: BC Managed Care – PPO | Admitting: Family Medicine

## 2014-06-12 NOTE — Assessment & Plan Note (Signed)
Pt's PE WNL.  UTD on GYN.  Updated vaccines in preparation for trip.  Check labs.  Anticipatory guidance provided.

## 2014-06-13 ENCOUNTER — Other Ambulatory Visit (INDEPENDENT_AMBULATORY_CARE_PROVIDER_SITE_OTHER): Payer: BC Managed Care – PPO | Admitting: General Practice

## 2014-06-13 ENCOUNTER — Encounter: Payer: Self-pay | Admitting: General Practice

## 2014-06-13 DIAGNOSIS — Z23 Encounter for immunization: Secondary | ICD-10-CM

## 2014-07-20 ENCOUNTER — Telehealth: Payer: Self-pay | Admitting: Family Medicine

## 2014-07-20 NOTE — Telephone Encounter (Signed)
Left message on [phone] (Mobile) *Preferred* for patient to return call.  eal

## 2014-07-20 NOTE — Telephone Encounter (Signed)
Please advise per last OV pt was given Doxycycline and also the 1st Hep A shot. Can you find out what further questions she may have? [facility]

## 2014-07-20 NOTE — Telephone Encounter (Signed)
Caller name:Katherine [name] Relationship to patient:self Can be reached:Cell in chart  Reason for call: PT travelling to [place] in 3 weeks, curious on immunizations Etc. States she spoke with Dr. Beverely Lowabori regarding.

## 2014-07-21 NOTE — Telephone Encounter (Signed)
Spoke to patient regarding travel.  Patient stated she wanted typhoid medications.  [facility].  Patient stated understanding.  eal

## 2014-07-22 ENCOUNTER — Encounter: Payer: Self-pay | Admitting: Internal Medicine

## 2014-07-22 MED ORDER — TYPHOID VACCINE PO CPDR: DELAYED_RELEASE_CAPSULE | ORAL | Status: DC

## 2014-07-22 NOTE — Telephone Encounter (Signed)
Pt.notified

## 2014-07-22 NOTE — Telephone Encounter (Signed)
Med filled to [facility].

## 2014-07-22 NOTE — Telephone Encounter (Signed)
Ok for typhoid vaccine (live, oral)- 1 capsule 1 hr before meal every other day x4 doses to be completed at least 1 week before travel

## 2014-07-22 NOTE — Addendum Note (Signed)
Addended by: Jackson LatinoYLER, JESSICA L on: 07/22/2014 11:17 AM   Modules accepted: Orders

## 2014-07-22 NOTE — Telephone Encounter (Signed)
Patient states that it will cost her $50 for the [facility] to write her a typhoid medication script and wants to know if Dr. Beverely Lowabori would write her a script  Best # [phone]

## 2014-07-29 ENCOUNTER — Encounter: Payer: Self-pay | Admitting: Internal Medicine

## 2014-09-23 ENCOUNTER — Telehealth: Payer: Self-pay | Admitting: Family Medicine

## 2014-09-23 NOTE — Telephone Encounter (Signed)
Nurse notified  

## 2014-09-23 NOTE — Telephone Encounter (Signed)
I was calling to reschedule 12/09/14 appointment with Dr. Beverely Lowabori. This appointment is for 2nd hep A inj. Does patient need to see Dr. Beverely Lowabori or does this need to be on the nurse schedule?

## 2014-09-23 NOTE — Telephone Encounter (Signed)
Nurse visit scheduled for 09/23/14. Please enter order. Thanks!

## 2014-09-23 NOTE — Telephone Encounter (Signed)
This was written on the last AVS as only needing a nurse visit. Please schedule her as that .

## 2014-11-15 ENCOUNTER — Telehealth: Payer: Self-pay | Admitting: Family Medicine

## 2014-11-15 MED ORDER — CEPHALEXIN 500 MG PO CAPS: 500.0000 mg | ORAL_CAPSULE | Freq: Four times a day (QID) | ORAL | Status: DC

## 2014-11-15 NOTE — Telephone Encounter (Signed)
Please advise? Shouldn't she see UC there?

## 2014-11-15 NOTE — Telephone Encounter (Signed)
Med filled and pt notified.  

## 2014-11-15 NOTE — Telephone Encounter (Signed)
Caller name: Alison Murrayeri Ansley Relationship to patient: self Can be reached: [phone] Pharmacy: [facility], ph# [phone]  Reason for call: [place] UTI. She is asking if a med can be called in for her.

## 2014-11-15 NOTE — Telephone Encounter (Signed)
Ok for Keflex 500mg  BID x5 days (#10).  If no improvement in sxs after completion of abx will need OV w/ me to get urine and culture and possibly additional w/u.

## 2014-12-09 ENCOUNTER — Ambulatory Visit (INDEPENDENT_AMBULATORY_CARE_PROVIDER_SITE_OTHER): Payer: BLUE CROSS/BLUE SHIELD | Admitting: *Deleted

## 2014-12-09 ENCOUNTER — Ambulatory Visit: Payer: BC Managed Care – PPO | Admitting: Family Medicine

## 2014-12-09 DIAGNOSIS — Z23 Encounter for immunization: Secondary | ICD-10-CM

## 2014-12-09 NOTE — Progress Notes (Signed)
Pre visit review using our clinic review tool, if applicable. No additional management support is needed unless otherwise documented below in the visit note.  Patient presents for 2nd Hep A vaccine:   Per physical note 06/08/14:  Patient Instructions     Schedule a nurse visit in 6 months for your 2nd Hep A

## 2014-12-23 ENCOUNTER — Ambulatory Visit: Payer: BLUE CROSS/BLUE SHIELD | Admitting: Family Medicine

## 2015-01-10 ENCOUNTER — Emergency Department (HOSPITAL_COMMUNITY)
Admission: EM | Admit: 2015-01-10 | Discharge: 2015-01-10 | Disposition: A | Discharge status: Home / Self Care | Payer: BLUE CROSS/BLUE SHIELD | Attending: Emergency Medicine | Admitting: Emergency Medicine

## 2015-01-10 ENCOUNTER — Encounter (HOSPITAL_COMMUNITY): Payer: Self-pay | Admitting: *Deleted

## 2015-01-10 ENCOUNTER — Emergency Department (HOSPITAL_COMMUNITY): Payer: BLUE CROSS/BLUE SHIELD

## 2015-01-10 DIAGNOSIS — G43909 Migraine, unspecified, not intractable, without status migrainosus: Secondary | ICD-10-CM | POA: Diagnosis not present

## 2015-01-10 DIAGNOSIS — Z79899 Other long term (current) drug therapy: Secondary | ICD-10-CM | POA: Diagnosis not present

## 2015-01-10 DIAGNOSIS — R1031 Right lower quadrant pain: Secondary | ICD-10-CM

## 2015-01-10 DIAGNOSIS — Z87440 Personal history of urinary (tract) infections: Secondary | ICD-10-CM | POA: Insufficient documentation

## 2015-01-10 DIAGNOSIS — Z7951 Long term (current) use of inhaled steroids: Secondary | ICD-10-CM | POA: Diagnosis not present

## 2015-01-10 DIAGNOSIS — Z87891 Personal history of nicotine dependence: Secondary | ICD-10-CM | POA: Insufficient documentation

## 2015-01-10 DIAGNOSIS — Z3202 Encounter for pregnancy test, result negative: Secondary | ICD-10-CM | POA: Insufficient documentation

## 2015-01-10 DIAGNOSIS — E039 Hypothyroidism, unspecified: Secondary | ICD-10-CM | POA: Diagnosis not present

## 2015-01-10 LAB — COMPREHENSIVE METABOLIC PANEL
ALK PHOS: 36 U/L — AB (ref 38–126)
ALT: 21 U/L (ref 14–54)
ANION GAP: 5 (ref 5–15)
AST: 26 U/L (ref 15–41)
Albumin: 3.9 g/dL (ref 3.5–5.0)
BUN: 17 mg/dL (ref 6–20)
CALCIUM: 8.4 mg/dL — AB (ref 8.9–10.3)
CO2: 23 mmol/L (ref 22–32)
CREATININE: 0.75 mg/dL (ref 0.44–1.00)
Chloride: 109 mmol/L (ref 101–111)
GFR calc Af Amer: >60 mL/min (ref >60)
GFR calc non Af Amer: >60 mL/min (ref >60)
GLUCOSE: 102 mg/dL — AB (ref 65–99)
POTASSIUM: 3.4 mmol/L — AB (ref 3.5–5.1)
Sodium: 137 mmol/L (ref 135–145)
TOTAL PROTEIN: 6.5 g/dL (ref 6.5–8.1)
Total Bilirubin: 0.3 mg/dL (ref 0.3–1.2)

## 2015-01-10 LAB — URINALYSIS, ROUTINE W REFLEX MICROSCOPIC
Bilirubin Urine: NEGATIVE
GLUCOSE, UA: NEGATIVE mg/dL
HGB URINE DIPSTICK: NEGATIVE
Ketones, ur: NEGATIVE mg/dL
Leukocytes, UA: NEGATIVE
NITRITE: NEGATIVE
Protein, ur: NEGATIVE mg/dL
SPECIFIC GRAVITY, URINE: 1.022 (ref 1.005–1.030)
Urobilinogen, UA: 0.2 mg/dL (ref 0.0–1.0)
pH: 6.5 (ref 5.0–8.0)

## 2015-01-10 LAB — CBC
HEMATOCRIT: 36.3 % (ref 36.0–46.0)
HEMOGLOBIN: 12 g/dL (ref 12.0–15.0)
MCH: 30.8 pg (ref 26.0–34.0)
MCHC: 33.1 g/dL (ref 30.0–36.0)
MCV: 93.1 fL (ref 78.0–100.0)
Platelets: 181 10*3/uL (ref 150–400)
RBC: 3.9 MIL/uL (ref 3.87–5.11)
RDW: 12.7 % (ref 11.5–15.5)
WBC: 3.2 10*3/uL — ABNORMAL LOW (ref 4.0–10.5)

## 2015-01-10 LAB — LIPASE, BLOOD: Lipase: 29 U/L (ref 22–51)

## 2015-01-10 LAB — I-STAT BETA HCG BLOOD, ED (MC, WL, AP ONLY): I-stat hCG, quantitative: <5 m[IU]/mL (ref <5)

## 2015-01-10 MED ORDER — IOHEXOL 300 MG/ML  SOLN
25.0000 mL | Freq: Once | INTRAMUSCULAR | Status: AC | PRN
  Administered 2015-01-10: 25 mL via ORAL

## 2015-01-10 MED ORDER — IOHEXOL 300 MG/ML  SOLN
100.0000 mL | Freq: Once | INTRAMUSCULAR | Status: AC | PRN
  Administered 2015-01-10: 100 mL via INTRAVENOUS

## 2015-01-10 NOTE — ED Notes (Addendum)
Pt reports RLQ pain with "rebound tenderness" per family at bedside.  Pt reports taking NSAIDS prior to coming to the ED.  Still has her appendix.

## 2015-01-10 NOTE — Discharge Instructions (Signed)
Your lab work and CT abdomen is normal today. Please follow up with your doctor if symptoms continue. Return if worsening or new concerning symptoms.    Abdominal Pain, Women Abdominal (stomach, pelvic, or belly) pain can be caused by many things. It is important to tell your doctor:  The location of the pain.  Does it come and go or is it present all the time?  Are there things that start the pain (eating certain foods, exercise)?  Are there other symptoms associated with the pain (fever, nausea, vomiting, diarrhea)? All of this is helpful to know when trying to find the cause of the pain. CAUSES   Stomach: virus or bacteria infection, or ulcer.  Intestine: appendicitis (inflamed appendix), regional ileitis (Crohn's disease), ulcerative colitis (inflamed colon), irritable bowel syndrome, diverticulitis (inflamed diverticulum of the colon), or cancer of the stomach or intestine.  Gallbladder disease or stones in the gallbladder.  Kidney disease, kidney stones, or infection.  Pancreas infection or cancer.  Fibromyalgia (pain disorder).  Diseases of the female organs:  Uterus: fibroid (non-cancerous) tumors or infection.  Fallopian tubes: infection or tubal pregnancy.  Ovary: cysts or tumors.  Pelvic adhesions (scar tissue).  Endometriosis (uterus lining tissue growing in the pelvis and on the pelvic organs).  Pelvic congestion syndrome (female organs filling up with blood just before the menstrual period).  Pain with the menstrual period.  Pain with ovulation (producing an egg).  Pain with an IUD (intrauterine device, birth control) in the uterus.  Cancer of the female organs.  Functional pain (pain not caused by a disease, may improve without treatment).  Psychological pain.  Depression. DIAGNOSIS  Your doctor will decide the seriousness of your pain by doing an examination.  Blood tests.  X-rays.  Ultrasound.  CT scan (computed tomography, special type  of X-ray).  MRI (magnetic resonance imaging).  Cultures, for infection.  Barium enema (dye inserted in the large intestine, to better view it with X-rays).  Colonoscopy (looking in intestine with a lighted tube).  Laparoscopy (minor surgery, looking in abdomen with a lighted tube).  Major abdominal exploratory surgery (looking in abdomen with a large incision). TREATMENT  The treatment will depend on the cause of the pain.   Many cases can be observed and treated at home.  Over-the-counter medicines recommended by your caregiver.  Prescription medicine.  Antibiotics, for infection.  Birth control pills, for painful periods or for ovulation pain.  Hormone treatment, for endometriosis.  Nerve blocking injections.  Physical therapy.  Antidepressants.  Counseling with a psychologist or psychiatrist.  Minor or major surgery. HOME CARE INSTRUCTIONS   Do not take laxatives, unless directed by your caregiver.  Take over-the-counter pain medicine only if ordered by your caregiver. Do not take aspirin because it can cause an upset stomach or bleeding.  Try a clear liquid diet (broth or water) as ordered by your caregiver. Slowly move to a bland diet, as tolerated, if the pain is related to the stomach or intestine.  Have a thermometer and take your temperature several times a day, and record it.  Bed rest and sleep, if it helps the pain.  Avoid sexual intercourse, if it causes pain.  Avoid stressful situations.  Keep your follow-up appointments and tests, as your caregiver orders.  If the pain does not go away with medicine or surgery, you may try:  Acupuncture.  Relaxation exercises (yoga, meditation).  Group therapy.  Counseling. SEEK MEDICAL CARE IF:   You notice certain foods cause stomach  pain.  Your home care treatment is not helping your pain.  You need stronger pain medicine.  You want your IUD removed.  You feel faint or lightheaded.  You  develop nausea and vomiting.  You develop a rash.  You are having side effects or an allergy to your medicine. SEEK IMMEDIATE MEDICAL CARE IF:   Your pain does not go away or gets worse.  You have a fever.  Your pain is felt only in portions of the abdomen. The right side could possibly be appendicitis. The left lower portion of the abdomen could be colitis or diverticulitis.  You are passing blood in your stools (bright red or black tarry stools, with or without vomiting).  You have blood in your urine.  You develop chills, with or without a fever.  You pass out. MAKE SURE YOU:   Understand these instructions.  Will watch your condition.  Will get help right away if you are not doing well or get worse. Document Released: 04/07/2007 Document Revised: 10/25/2013 Document Reviewed: 04/27/2009 [facility] Patient Information 2015 [employer] [place]. This information is not intended to replace advice given to you by your health care provider. Make sure you discuss any questions you have with your health care provider.

## 2015-01-11 ENCOUNTER — Encounter: Payer: BLUE CROSS/BLUE SHIELD | Admitting: Medical

## 2015-01-11 NOTE — Progress Notes (Signed)
This encounter was created in error - please disregard.

## 2015-01-11 NOTE — ED Provider Notes (Signed)
CSN: [id number]     Arrival date & time 01/10/15  1826 History   First MD Initiated Contact with Patient 01/10/15 1911     Chief Complaint  Patient presents with  . Abdominal Pain     (Consider location/radiation/quality/duration/timing/severity/associated sxs/prior Treatment) HPI Katherine [name] is a 39 y.o. female with no medical problems, presents to emergency department complaining of abdominal pain. Patient states that her pain started several days ago. States it is in the right lower quadrant. She has been taking ibuprofen for her pain. She denies any fever or chills until this evening. She states her temperature was 99.6. She reports nonspecific nausea, no vomiting. No diarrhea. States was treated for UTI last week with Cipro which she finished. States urinary symptoms resolved. Patient's husband who is a radiologist pressed on her abdomen today, and states "she jumped out of her seat when I did rebound tenderness." He is concerned the patient may have appendicitis. Patient denies anorexia. She states she is eating and drinking well. No other complaints. Denies any vaginal discharge or bleeding.  Past Medical History  Diagnosis Date  . Unspecified hypothyroidism   . Migraine    Past Surgical History  Procedure Laterality Date  . Mandible surgery     Family History  Problem Relation Age of Onset  . Cancer    . Hypertension    . Hyperlipidemia    . Heart attack    . Stroke    . Alcohol abuse Mother   . Cancer Mother     lung  . Heart disease Mother   . Hypertension Mother   . Hyperlipidemia Father   . Heart disease Father   . Hypertension Father   . Diabetes Father    History  Substance Use Topics  . Smoking status: Former Games developermoker  . Smokeless tobacco: Never Used  . Alcohol Use: Yes     Comment: occasionally wine   OB History    Gravida Para Term Preterm AB TAB SAB Ectopic Multiple Living   1              Review of Systems  Constitutional: Negative for fever,  chills and appetite change.  Respiratory: Negative for cough, chest tightness and shortness of breath.   Cardiovascular: Negative for chest pain, palpitations and leg swelling.  Gastrointestinal: Positive for abdominal pain. Negative for nausea, vomiting and diarrhea.  Genitourinary: Negative for dysuria, flank pain, vaginal bleeding, vaginal discharge, vaginal pain and pelvic pain.  Musculoskeletal: Negative for myalgias, arthralgias, neck pain and neck stiffness.  Skin: Negative for rash.  Neurological: Negative for dizziness, weakness and headaches.  All other systems reviewed and are negative.     Allergies  Review of patient's allergies indicates no known allergies.  Home Medications   Prior to Admission medications   Medication Sig Start Date End Date Taking? Authorizing Provider  ALPRAZolam Prudy Feeler(XANAX) 0.5 MG tablet Take 0.5 mg by mouth 3 (three) times daily as needed for anxiety.  09/25/13  Yes Historical Provider, MD  BIOTIN PO Take 1 tablet by mouth daily.    Yes Historical Provider, MD  butalbital-acetaminophen-caffeine (FIORICET, ESGIC) 50-325-40 MG per tablet Take 1 tablet by mouth 2 (two) times daily as needed for headache.   Yes Historical Provider, MD  CALCIUM PO Take 1 tablet by mouth daily.    Yes Historical Provider, MD  fluticasone (FLONASE) 50 MCG/ACT nasal spray Place 2 sprays into both nostrils daily.   Yes Historical Provider, MD  ibuprofen (ADVIL,MOTRIN) 200 MG  tablet Take 600 mg by mouth every 6 (six) hours as needed for moderate pain.   Yes Historical Provider, MD  levothyroxine (SYNTHROID, LEVOTHROID) 112 MCG tablet Take 1 tablet (112 mcg total) by mouth daily. 04/29/14  Yes Romero Belling, MD  lisdexamfetamine (VYVANSE) 60 MG capsule Take 60 mg by mouth every other day.   Yes Historical Provider, MD  loratadine (CLARITIN) 10 MG tablet Take 10 mg by mouth daily.   Yes Historical Provider, MD  Multiple Vitamin (MULTIVITAMIN) tablet Take 1 tablet by mouth daily.   Yes  Historical Provider, MD  rizatriptan (MAXALT) 10 MG tablet Take 1 tablet (10 mg total) by mouth as needed. Patient taking differently: Take 10 mg by mouth as needed for migraine.  06/08/14  Yes Sheliah Hatch, MD  sertraline (ZOLOFT) 100 MG tablet Take 100 mg by mouth daily.   Yes Historical Provider, MD  topiramate (TOPAMAX) 50 MG tablet Take 50 mg by mouth 2 (two) times daily.  09/24/13  Yes Historical Provider, MD  valACYclovir (VALTREX) 500 MG tablet Take 500 mg by mouth 2 (two) times daily as needed (outbreak).    Yes Historical Provider, MD  VYVANSE 70 MG capsule Take 70 mg by mouth every other day. 12/16/14  Yes Historical Provider, MD  cephALEXin (KEFLEX) 500 MG capsule Take 1 capsule (500 mg total) by mouth 4 (four) times daily. Patient not taking: Reported on 01/10/2015 11/15/14   Sheliah Hatch, MD  typhoid (VIVOTIF) DR capsule 1 capsule 1 hr before a meal every other day x4 doses to be completed at least 1 week before travel Patient not taking: Reported on 01/10/2015 07/22/14   Sheliah Hatch, MD   BP 125/78 mmHg  Pulse 84  Temp(Src) 98.3 F (36.8 C) (Oral)  Resp 18  SpO2 100%  LMP 01/07/2015 Physical Exam  Constitutional: She appears well-developed and well-nourished. No distress.  HENT:  Head: Normocephalic.  Eyes: Conjunctivae are normal.  Neck: Neck supple.  Cardiovascular: Normal rate, regular rhythm and normal heart sounds.   Pulmonary/Chest: Effort normal and breath sounds normal. No respiratory distress. She has no wheezes. She has no rales.  Abdominal: Soft. Bowel sounds are normal. She exhibits no distension. There is tenderness. There is no rebound.  Mild right lower quadrant tenderness, no guarding, no rebound tenderness. No CVA tenderness bilaterally.  Musculoskeletal: She exhibits no edema.  Neurological: She is alert.  Skin: Skin is warm and dry.  Psychiatric: She has a normal mood and affect. Her behavior is normal.  Nursing note and vitals  reviewed.   ED Course  Procedures (including critical care time) Labs Review Labs Reviewed  COMPREHENSIVE METABOLIC PANEL - Abnormal; Notable for the following:    Potassium 3.4 (*)    Glucose, Bld 102 (*)    Calcium 8.4 (*)    Alkaline Phosphatase 36 (*)    All other components within normal limits  CBC - Abnormal; Notable for the following:    WBC 3.2 (*)    All other components within normal limits  LIPASE, BLOOD  URINALYSIS, ROUTINE W REFLEX MICROSCOPIC (NOT AT [facility])  I-STAT BETA HCG BLOOD, ED ([facility], [facility], [facility] ONLY)    Imaging Review Ct Abdomen Pelvis W Contrast  01/10/2015   CLINICAL DATA:  Acute onset of right lower quadrant abdominal pain and rebound tenderness. Initial encounter.  EXAM: CT ABDOMEN AND PELVIS WITH CONTRAST  TECHNIQUE: Multidetector CT imaging of the abdomen and pelvis was performed using the standard protocol following bolus administration  of intravenous contrast.  CONTRAST:  OMNIPAQUE IOHEXOL 300 MG/ML  SOLN  COMPARISON:  Pelvic ultrasound performed 12/20/2011  FINDINGS: The visualized lung bases are clear.  The liver and spleen are unremarkable in appearance. The gallbladder is within normal limits. The pancreas and adrenal glands are unremarkable.  The kidneys are unremarkable in appearance. There is no evidence of hydronephrosis. No renal or ureteral stones are seen. No perinephric stranding is appreciated.  No free fluid is identified. The small bowel is unremarkable in appearance. The stomach is within normal limits. No acute vascular abnormalities are seen.  The appendix is normal in caliber and contains a small amount of air and contrast, without evidence of appendicitis. The colon is unremarkable in appearance.  The bladder is mildly distended and grossly remarkable. The uterus is within normal limits. A tampon is noted at the vagina. The ovaries are grossly symmetric. No suspicious adnexal masses are seen. No inguinal lymphadenopathy is seen.  No acute  osseous abnormalities are identified.  IMPRESSION: Unremarkable contrast-enhanced CT of the abdomen and pelvis. No evidence of appendicitis.   Electronically Signed   By: Roanna Raider M.D.   On: 01/10/2015 22:41     EKG Interpretation None      MDM   Final diagnoses:  Right lower quadrant abdominal pain   Patient with right lower quadrant pain, no vaginal complaints. No guarding or rebound tenderness on exam. I discussed with Dr. Dalene Seltzer, who evaluated pt as well. No other explanation for pain. Will get CT abd to ro appendicitis.   Labs unremarkable. CT negative. Will dc home with outpatient follow up.   Filed Vitals:   01/10/15 1836 01/10/15 2024 01/10/15 2224  BP: 129/81 112/70 125/78  Pulse: 68 64 84  Temp: 98.5 F (36.9 C) 98.3 F (36.8 C)   TempSrc: Oral Oral   Resp: 16 18 18   SpO2: 100% 98% 100%       Jaynie Crumble, PA-C 01/11/15 0138  Alvira Monday, MD 01/11/15 1538

## 2015-02-09 ENCOUNTER — Ambulatory Visit (HOSPITAL_BASED_OUTPATIENT_CLINIC_OR_DEPARTMENT_OTHER): Admit: 2015-02-09 | Payer: BLUE CROSS/BLUE SHIELD | Admitting: Orthopedic Surgery

## 2015-02-09 ENCOUNTER — Encounter (HOSPITAL_BASED_OUTPATIENT_CLINIC_OR_DEPARTMENT_OTHER): Payer: Self-pay

## 2015-02-09 SURGERY — EXCISION, MORTON'S NEUROMA
Anesthesia: General | Site: Thumb | Laterality: Right

## 2015-04-14 ENCOUNTER — Encounter: Payer: BC Managed Care – PPO | Admitting: Family Medicine

## 2015-05-03 LAB — HM MAMMOGRAPHY

## 2015-05-10 ENCOUNTER — Telehealth: Payer: Self-pay | Admitting: Family Medicine

## 2015-05-10 ENCOUNTER — Other Ambulatory Visit: Payer: Self-pay | Admitting: Endocrinology

## 2015-05-10 MED ORDER — LEVOTHYROXINE SODIUM 112 MCG PO TABS: 112.0000 ug | ORAL_TABLET | Freq: Every day | ORAL | Status: DC

## 2015-05-10 NOTE — Telephone Encounter (Signed)
Medication filled to pharmacy as requested.   

## 2015-05-10 NOTE — Telephone Encounter (Signed)
Caller name: Self   Can be reached: [phone]  Pharmacy:  [facility] [address] - [place], [place] - [id number] [phone] (Phone) [phone] (Fax)        Reason for call: Patient needs refill on levothyroxine (SYNTHROID, LEVOTHROID) 112 MCG tablet [[id number]][[id number]]  And pharmacy will not refill because it has her previous doctors name

## 2015-06-02 ENCOUNTER — Encounter: Payer: Self-pay | Admitting: Family Medicine

## 2015-06-02 ENCOUNTER — Ambulatory Visit (INDEPENDENT_AMBULATORY_CARE_PROVIDER_SITE_OTHER): Payer: BLUE CROSS/BLUE SHIELD | Admitting: Family Medicine

## 2015-06-02 VITALS — BP 120/80 | HR 50 | Temp 98.0°F | Resp 16 | Ht 60.0 in | Wt 120.0 lb

## 2015-06-02 DIAGNOSIS — Z0000 Encounter for general adult medical examination without abnormal findings: Secondary | ICD-10-CM

## 2015-06-02 DIAGNOSIS — Z23 Encounter for immunization: Secondary | ICD-10-CM | POA: Diagnosis not present

## 2015-06-02 LAB — CBC WITH DIFFERENTIAL/PLATELET
BASOS ABS: 0 10*3/uL (ref 0.0–0.1)
Basophils Relative: 0.3 % (ref 0.0–3.0)
Eosinophils Absolute: 0.2 10*3/uL (ref 0.0–0.7)
Eosinophils Relative: 3 % (ref 0.0–5.0)
HEMATOCRIT: 38.5 % (ref 36.0–46.0)
Hemoglobin: 12.9 g/dL (ref 12.0–15.0)
LYMPHS PCT: 24.7 % (ref 12.0–46.0)
Lymphs Abs: 1.6 10*3/uL (ref 0.7–4.0)
MCHC: 33.5 g/dL (ref 30.0–36.0)
MCV: 92.8 fl (ref 78.0–100.0)
MONOS PCT: 5.6 % (ref 3.0–12.0)
Monocytes Absolute: 0.4 10*3/uL (ref 0.1–1.0)
NEUTROS PCT: 66.4 % (ref 43.0–77.0)
Neutro Abs: 4.3 10*3/uL (ref 1.4–7.7)
Platelets: 237 10*3/uL (ref 150.0–400.0)
RBC: 4.15 Mil/uL (ref 3.87–5.11)
RDW: 13.1 % (ref 11.5–15.5)
WBC: 6.4 10*3/uL (ref 4.0–10.5)

## 2015-06-02 LAB — BASIC METABOLIC PANEL
BUN: 17 mg/dL (ref 6–23)
CALCIUM: 9.2 mg/dL (ref 8.4–10.5)
CO2: 27 mEq/L (ref 19–32)
Chloride: 105 mEq/L (ref 96–112)
Creatinine, Ser: 0.8 mg/dL (ref 0.40–1.20)
GFR: 83.64 mL/min (ref >60.00)
Glucose, Bld: 81 mg/dL (ref 70–99)
Potassium: 3.8 mEq/L (ref 3.5–5.1)
SODIUM: 139 meq/L (ref 135–145)

## 2015-06-02 LAB — VITAMIN D 25 HYDROXY (VIT D DEFICIENCY, FRACTURES): VITD: 33.76 ng/mL (ref 30.00–100.00)

## 2015-06-02 LAB — HEPATIC FUNCTION PANEL
ALBUMIN: 4.1 g/dL (ref 3.5–5.2)
ALT: 18 U/L (ref 0–35)
AST: 23 U/L (ref 0–37)
Alkaline Phosphatase: 39 U/L (ref 39–117)
Bilirubin, Direct: 0.1 mg/dL (ref 0.0–0.3)
TOTAL PROTEIN: 6.5 g/dL (ref 6.0–8.3)
Total Bilirubin: 0.5 mg/dL (ref 0.2–1.2)

## 2015-06-02 LAB — LIPID PANEL
CHOLESTEROL: 209 mg/dL — AB (ref 0–200)
HDL: 78.4 mg/dL (ref >39.00)
LDL CALC: 118 mg/dL — AB (ref 0–99)
NonHDL: 130.83
TRIGLYCERIDES: 66 mg/dL (ref 0.0–149.0)
Total CHOL/HDL Ratio: 3
VLDL: 13.2 mg/dL (ref 0.0–40.0)

## 2015-06-02 LAB — TSH: TSH: 3.52 u[IU]/mL (ref 0.35–4.50)

## 2015-06-02 NOTE — Progress Notes (Signed)
Pre visit review using our clinic review tool, if applicable. No additional management support is needed unless otherwise documented below in the visit note. 

## 2015-06-02 NOTE — Progress Notes (Signed)
   Subjective:    Patient ID: Katherine [name], female    DOB: 05/05/1976, 39 y.o.   MRN: [id number]  HPI CPE- UTD on mammo, GYN ([facility]).  No concerns today.   Review of Systems Patient reports no vision/ hearing changes, adenopathy,fever, weight change,  persistant/recurrent hoarseness , swallowing issues, chest pain, palpitations, edema, persistant/recurrent cough, hemoptysis, dyspnea (rest/exertional/paroxysmal nocturnal), gastrointestinal bleeding (melena, rectal bleeding), abdominal pain, significant heartburn, bowel changes, GU symptoms (dysuria, hematuria, incontinence), Gyn symptoms (abnormal  bleeding, pain),  syncope, focal weakness, memory loss, numbness & tingling, skin/hair/nail changes, abnormal bruising or bleeding, anxiety, or depression.     Objective:   Physical Exam General Appearance:    Alert, cooperative, no distress, appears stated age  Head:    Normocephalic, without obvious abnormality, atraumatic  Eyes:    PERRL, conjunctiva/corneas clear, EOM's intact, fundi    benign, both eyes  Ears:    Normal TM's and external ear canals, both ears  Nose:   Nares normal, septum midline, mucosa normal, no drainage    or sinus tenderness  Throat:   Lips, mucosa, and tongue normal; teeth and gums normal  Neck:   Supple, symmetrical, trachea midline, no adenopathy;    Thyroid: no enlargement/tenderness/nodules  Back:     Symmetric, no curvature, ROM normal, no CVA tenderness  Lungs:     Clear to auscultation bilaterally, respirations unlabored  Chest Wall:    No tenderness or deformity   Heart:    Regular rate and rhythm, S1 and S2 normal, no murmur, rub   or gallop  Breast Exam:    Deferred to GYN  Abdomen:     Soft, non-tender, bowel sounds active all four quadrants,    no masses, no organomegaly  Genitalia:    Deferred to GYN  Rectal:    Extremities:   Extremities normal, atraumatic, no cyanosis or edema  Pulses:   2+ and symmetric all extremities  Skin:   Skin color, texture,  turgor normal, no rashes or lesions  Lymph nodes:   Cervical, supraclavicular, and axillary nodes normal  Neurologic:   CNII-XII intact, normal strength, sensation and reflexes    throughout          Assessment & Plan:

## 2015-06-02 NOTE — Assessment & Plan Note (Signed)
Pt's PE WNL.  UTD on GYN.  Check labs.  Anticipatory guidance provided. Flu shot given.

## 2015-06-02 NOTE — Patient Instructions (Signed)
Follow up in 1 year or as needed We'll notify you of your lab results and make any changes if needed Keep up the good work on healthy diet and regular exercise- you look great!!! Call with any questions or concerns If you want to join us at the new [place] office, any scheduled appointments will automatically transfer and we will see you at [address], [place], [place] [address] (OPENING 06/27/15) Happy Holidays!!! 

## 2015-06-05 ENCOUNTER — Encounter: Payer: Self-pay | Admitting: General Practice

## 2015-06-23 ENCOUNTER — Other Ambulatory Visit: Payer: Self-pay | Admitting: Family Medicine

## 2015-06-23 NOTE — Telephone Encounter (Signed)
Medication filled to pharmacy as requested.   

## 2015-10-30 DIAGNOSIS — N390 Urinary tract infection, site not specified: Secondary | ICD-10-CM | POA: Diagnosis not present

## 2016-03-14 DIAGNOSIS — N951 Menopausal and female climacteric states: Secondary | ICD-10-CM | POA: Diagnosis not present

## 2016-04-22 ENCOUNTER — Other Ambulatory Visit: Payer: Self-pay | Admitting: Family Medicine

## 2016-05-01 ENCOUNTER — Other Ambulatory Visit: Payer: Self-pay | Admitting: Family Medicine

## 2016-05-23 LAB — HM PAP SMEAR

## 2016-05-23 LAB — HM MAMMOGRAPHY: HM Mammogram: NORMAL (ref 0–4)

## 2016-05-29 DIAGNOSIS — Z681 Body mass index (BMI) 19 or less, adult: Secondary | ICD-10-CM | POA: Diagnosis not present

## 2016-05-29 DIAGNOSIS — Z1231 Encounter for screening mammogram for malignant neoplasm of breast: Secondary | ICD-10-CM | POA: Diagnosis not present

## 2016-05-29 DIAGNOSIS — Z01419 Encounter for gynecological examination (general) (routine) without abnormal findings: Secondary | ICD-10-CM | POA: Diagnosis not present

## 2016-06-06 ENCOUNTER — Encounter: Payer: Self-pay | Admitting: Family Medicine

## 2016-06-06 ENCOUNTER — Ambulatory Visit (INDEPENDENT_AMBULATORY_CARE_PROVIDER_SITE_OTHER): Payer: BLUE CROSS/BLUE SHIELD | Admitting: Family Medicine

## 2016-06-06 VITALS — BP 118/60 | HR 56 | Temp 98.0°F | Resp 16 | Ht 60.0 in | Wt 119.1 lb

## 2016-06-06 DIAGNOSIS — Z23 Encounter for immunization: Secondary | ICD-10-CM

## 2016-06-06 DIAGNOSIS — Z0000 Encounter for general adult medical examination without abnormal findings: Secondary | ICD-10-CM | POA: Diagnosis not present

## 2016-06-06 LAB — HEPATIC FUNCTION PANEL
ALT: 14 U/L (ref 0–35)
AST: 16 U/L (ref 0–37)
Albumin: 4.3 g/dL (ref 3.5–5.2)
Alkaline Phosphatase: 26 U/L — ABNORMAL LOW (ref 39–117)
Bilirubin, Direct: 0.1 mg/dL (ref 0.0–0.3)
TOTAL PROTEIN: 6.5 g/dL (ref 6.0–8.3)
Total Bilirubin: 0.6 mg/dL (ref 0.2–1.2)

## 2016-06-06 LAB — LIPID PANEL
CHOLESTEROL: 235 mg/dL — AB (ref 0–200)
HDL: 95.1 mg/dL (ref >39.00)
LDL Cholesterol: 123 mg/dL — ABNORMAL HIGH (ref 0–99)
NonHDL: 139.45
TRIGLYCERIDES: 83 mg/dL (ref 0.0–149.0)
Total CHOL/HDL Ratio: 2
VLDL: 16.6 mg/dL (ref 0.0–40.0)

## 2016-06-06 LAB — BASIC METABOLIC PANEL
BUN: 17 mg/dL (ref 6–23)
CALCIUM: 9.2 mg/dL (ref 8.4–10.5)
CO2: 28 meq/L (ref 19–32)
CREATININE: 0.91 mg/dL (ref 0.40–1.20)
Chloride: 107 mEq/L (ref 96–112)
GFR: 71.73 mL/min (ref >60.00)
GLUCOSE: 84 mg/dL (ref 70–99)
Potassium: 3.9 mEq/L (ref 3.5–5.1)
Sodium: 140 mEq/L (ref 135–145)

## 2016-06-06 LAB — VITAMIN D 25 HYDROXY (VIT D DEFICIENCY, FRACTURES): VITD: 68.1 ng/mL (ref 30.00–100.00)

## 2016-06-06 LAB — TSH: TSH: 1.54 u[IU]/mL (ref 0.35–4.50)

## 2016-06-06 NOTE — Progress Notes (Signed)
   Subjective:    Patient ID: Katherine [name], female    DOB: 03/29/1976, 40 y.o.   MRN: [id number]  HPI CPE- UTD on GYN (pap and mammo), too young for colonoscopy.  UTD on Tdap.  Due for flu   Review of Systems Patient reports no vision/ hearing changes, adenopathy,fever, weight change,  persistant/recurrent hoarseness , swallowing issues, chest pain, palpitations, edema, persistant/recurrent cough, hemoptysis, dyspnea (rest/exertional/paroxysmal nocturnal), gastrointestinal bleeding (melena, rectal bleeding), abdominal pain, significant heartburn, bowel changes, GU symptoms (dysuria, hematuria, incontinence), Gyn symptoms (abnormal  bleeding, pain),  syncope, focal weakness, memory loss, numbness & tingling, skin/hair/nail changes, abnormal bruising or bleeding, anxiety, or depression.     Objective:   Physical Exam General Appearance:    Alert, cooperative, no distress, appears stated age  Head:    Normocephalic, without obvious abnormality, atraumatic  Eyes:    PERRL, conjunctiva/corneas clear, EOM's intact, fundi    benign, both eyes  Ears:    Normal TM's and external ear canals, both ears  Nose:   Nares normal, septum midline, mucosa normal, no drainage    or sinus tenderness  Throat:   Lips, mucosa, and tongue normal; teeth and gums normal  Neck:   Supple, symmetrical, trachea midline, no adenopathy;    Thyroid: no enlargement/tenderness/nodules  Back:     Symmetric, no curvature, ROM normal, no CVA tenderness  Lungs:     Clear to auscultation bilaterally, respirations unlabored  Chest Wall:    No tenderness or deformity   Heart:    Regular rate and rhythm, S1 and S2 normal, no murmur, rub   or gallop  Breast Exam:    Deferred to GYN  Abdomen:     Soft, non-tender, bowel sounds active all four quadrants,    no masses, no organomegaly  Genitalia:    Deferred to GYN  Rectal:    Extremities:   Extremities normal, atraumatic, no cyanosis or edema  Pulses:   2+ and symmetric all  extremities  Skin:   Skin color, texture, turgor normal, no rashes or lesions  Lymph nodes:   Cervical, supraclavicular, and axillary nodes normal  Neurologic:   CNII-XII intact, normal strength, sensation and reflexes    throughout          Assessment & Plan:

## 2016-06-06 NOTE — Patient Instructions (Signed)
Follow up in 1 year or as needed We'll notify you of your lab results and make any changes if needed Continue to work on healthy diet and regular exercise- you look great!!! Use the antifungal cream twice daily or the Nizoral shampoo once daily- apply, let sit for 5 minutes and then wash off in shower Call with any questions or concerns Happy Holidays!!! And Happy Early [place] Ouch[place]irthday!!!

## 2016-06-06 NOTE — Progress Notes (Signed)
Pre visit review using our clinic review tool, if applicable. No additional management support is needed unless otherwise documented below in the visit note. 

## 2016-06-06 NOTE — Assessment & Plan Note (Signed)
Pt's PE WNL.  UTD on GYN.  Flu shot given.  Check labs.  Anticipatory guidance provided.  

## 2016-06-07 LAB — CBC WITH DIFFERENTIAL/PLATELET
BASOS ABS: 0 10*3/uL (ref 0.0–0.1)
Basophils Relative: 0.5 % (ref 0.0–3.0)
EOS ABS: 0.1 10*3/uL (ref 0.0–0.7)
Eosinophils Relative: 1.9 % (ref 0.0–5.0)
HEMATOCRIT: 42.2 % (ref 36.0–46.0)
Hemoglobin: 14 g/dL (ref 12.0–15.0)
LYMPHS PCT: 20.3 % (ref 12.0–46.0)
Lymphs Abs: 1.2 10*3/uL (ref 0.7–4.0)
MCHC: 33.3 g/dL (ref 30.0–36.0)
MCV: 94.3 fl (ref 78.0–100.0)
MONO ABS: 0.3 10*3/uL (ref 0.1–1.0)
Monocytes Relative: 5 % (ref 3.0–12.0)
Neutro Abs: 4.4 10*3/uL (ref 1.4–7.7)
Neutrophils Relative %: 72.3 % (ref 43.0–77.0)
Platelets: 260 10*3/uL (ref 150.0–400.0)
RBC: 4.47 Mil/uL (ref 3.87–5.11)
RDW: 13.4 % (ref 11.5–15.5)
WBC: 6.1 10*3/uL (ref 4.0–10.5)

## 2016-07-25 ENCOUNTER — Other Ambulatory Visit: Payer: Self-pay | Admitting: Family Medicine

## 2016-08-08 DIAGNOSIS — F502 Bulimia nervosa: Secondary | ICD-10-CM | POA: Diagnosis not present

## 2016-10-31 DIAGNOSIS — H10413 Chronic giant papillary conjunctivitis, bilateral: Secondary | ICD-10-CM | POA: Diagnosis not present

## 2016-11-04 ENCOUNTER — Other Ambulatory Visit: Payer: Self-pay | Admitting: Family Medicine

## 2016-11-21 ENCOUNTER — Other Ambulatory Visit: Payer: Self-pay | Admitting: Family Medicine

## 2017-01-30 ENCOUNTER — Other Ambulatory Visit: Payer: Self-pay | Admitting: Family Medicine

## 2017-02-05 DIAGNOSIS — F502 Bulimia nervosa: Secondary | ICD-10-CM | POA: Diagnosis not present

## 2017-05-05 ENCOUNTER — Other Ambulatory Visit: Payer: Self-pay | Admitting: Family Medicine

## 2017-05-19 ENCOUNTER — Other Ambulatory Visit: Payer: Self-pay | Admitting: Family Medicine

## 2017-06-09 ENCOUNTER — Encounter: Payer: Self-pay | Admitting: Family Medicine

## 2017-06-09 ENCOUNTER — Other Ambulatory Visit: Payer: Self-pay

## 2017-06-09 ENCOUNTER — Ambulatory Visit (INDEPENDENT_AMBULATORY_CARE_PROVIDER_SITE_OTHER): Payer: BLUE CROSS/BLUE SHIELD | Admitting: Family Medicine

## 2017-06-09 VITALS — BP 124/84 | HR 76 | Temp 98.1°F | Resp 16 | Ht 60.0 in | Wt 123.5 lb

## 2017-06-09 DIAGNOSIS — Z0000 Encounter for general adult medical examination without abnormal findings: Secondary | ICD-10-CM | POA: Diagnosis not present

## 2017-06-09 DIAGNOSIS — Z23 Encounter for immunization: Secondary | ICD-10-CM | POA: Diagnosis not present

## 2017-06-09 DIAGNOSIS — E039 Hypothyroidism, unspecified: Secondary | ICD-10-CM | POA: Diagnosis not present

## 2017-06-09 LAB — LIPID PANEL
Cholesterol: 244 mg/dL — ABNORMAL HIGH (ref 0–200)
HDL: 92.7 mg/dL (ref >39.00)
LDL Cholesterol: 117 mg/dL — ABNORMAL HIGH (ref 0–99)
NonHDL: 151.29
Total CHOL/HDL Ratio: 3
Triglycerides: 171 mg/dL — ABNORMAL HIGH (ref 0.0–149.0)
VLDL: 34.2 mg/dL (ref 0.0–40.0)

## 2017-06-09 LAB — BASIC METABOLIC PANEL
BUN: 14 mg/dL (ref 6–23)
CALCIUM: 8.9 mg/dL (ref 8.4–10.5)
CHLORIDE: 104 meq/L (ref 96–112)
CO2: 28 meq/L (ref 19–32)
CREATININE: 0.84 mg/dL (ref 0.40–1.20)
GFR: 78.31 mL/min (ref >60.00)
GLUCOSE: 74 mg/dL (ref 70–99)
Potassium: 4.1 mEq/L (ref 3.5–5.1)
Sodium: 138 mEq/L (ref 135–145)

## 2017-06-09 LAB — CBC WITH DIFFERENTIAL/PLATELET
BASOS ABS: 0.1 10*3/uL (ref 0.0–0.1)
BASOS PCT: 1.1 % (ref 0.0–3.0)
EOS ABS: 0.2 10*3/uL (ref 0.0–0.7)
Eosinophils Relative: 4.4 % (ref 0.0–5.0)
HEMATOCRIT: 42.2 % (ref 36.0–46.0)
HEMOGLOBIN: 13.8 g/dL (ref 12.0–15.0)
LYMPHS PCT: 28 % (ref 12.0–46.0)
Lymphs Abs: 1.4 10*3/uL (ref 0.7–4.0)
MCHC: 32.7 g/dL (ref 30.0–36.0)
MCV: 95.5 fl (ref 78.0–100.0)
MONO ABS: 0.3 10*3/uL (ref 0.1–1.0)
Monocytes Relative: 6.9 % (ref 3.0–12.0)
Neutro Abs: 3 10*3/uL (ref 1.4–7.7)
Neutrophils Relative %: 59.6 % (ref 43.0–77.0)
PLATELETS: 247 10*3/uL (ref 150.0–400.0)
RBC: 4.42 Mil/uL (ref 3.87–5.11)
RDW: 12.9 % (ref 11.5–15.5)
WBC: 5 10*3/uL (ref 4.0–10.5)

## 2017-06-09 LAB — HEPATIC FUNCTION PANEL
ALT: 10 U/L (ref 0–35)
AST: 17 U/L (ref 0–37)
Albumin: 4 g/dL (ref 3.5–5.2)
Alkaline Phosphatase: 28 U/L — ABNORMAL LOW (ref 39–117)
Bilirubin, Direct: 0.1 mg/dL (ref 0.0–0.3)
TOTAL PROTEIN: 6.4 g/dL (ref 6.0–8.3)
Total Bilirubin: 0.6 mg/dL (ref 0.2–1.2)

## 2017-06-09 LAB — TSH: TSH: 7.25 u[IU]/mL — ABNORMAL HIGH (ref 0.35–4.50)

## 2017-06-09 NOTE — Patient Instructions (Signed)
Follow up in 1 year or as needed We'll notify you of your lab results and make any changes if needed Keep up the good work on healthy diet and regular exercise- you look great! Call with any questions or concerns Happy Holidays and Happy Early Birthday!!

## 2017-06-09 NOTE — Assessment & Plan Note (Signed)
Chronic problem.  Currently asymptomatic.  Check labs.  Adjust meds prn  

## 2017-06-09 NOTE — Assessment & Plan Note (Signed)
Pt's PE WNL.  UTD on GYN and Tdap.  Flu given.  Check labs.  Anticipatory guidance provided.

## 2017-06-09 NOTE — Progress Notes (Signed)
   Subjective:    Patient ID: Katherine [name], female    DOB: 05/15/1976, 41 y.o.   MRN: [id number]  HPI CPE- UTD on pap, mammo, Tdap.  Due for flu today.  No concerns today.   Review of Systems Patient reports no vision/ hearing changes, adenopathy,fever, weight change,  persistant/recurrent hoarseness , swallowing issues, chest pain, palpitations, edema, persistant/recurrent cough, hemoptysis, dyspnea (rest/exertional/paroxysmal nocturnal), gastrointestinal bleeding (melena, rectal bleeding), abdominal pain, significant heartburn, bowel changes, GU symptoms (dysuria, hematuria, incontinence), Gyn symptoms (abnormal  bleeding, pain),  syncope, focal weakness, memory loss, numbness & tingling, skin/hair/nail changes, abnormal bruising or bleeding, anxiety, or depression.     Objective:   Physical Exam General Appearance:    Alert, cooperative, no distress, appears stated age  Head:    Normocephalic, without obvious abnormality, atraumatic  Eyes:    PERRL, conjunctiva/corneas clear, EOM's intact, fundi    benign, both eyes  Ears:    Normal TM's and external ear canals, both ears  Nose:   Nares normal, septum midline, mucosa normal, no drainage    or sinus tenderness  Throat:   Lips, mucosa, and tongue normal; teeth and gums normal  Neck:   Supple, symmetrical, trachea midline, no adenopathy;    Thyroid: no enlargement/tenderness/nodules  Back:     Symmetric, no curvature, ROM normal, no CVA tenderness  Lungs:     Clear to auscultation bilaterally, respirations unlabored  Chest Wall:    No tenderness or deformity   Heart:    Regular rate and rhythm, S1 and S2 normal, no murmur, rub   or gallop  Breast Exam:    Deferred to GYN  Abdomen:     Soft, non-tender, bowel sounds active all four quadrants,    no masses, no organomegaly  Genitalia:    Deferred to GYN  Rectal:    Extremities:   Extremities normal, atraumatic, no cyanosis or edema  Pulses:   2+ and symmetric all extremities  Skin:    Skin color, texture, turgor normal, no rashes or lesions  Lymph nodes:   Cervical, supraclavicular, and axillary nodes normal  Neurologic:   CNII-XII intact, normal strength, sensation and reflexes    throughout          Assessment & Plan:

## 2017-06-10 ENCOUNTER — Other Ambulatory Visit: Payer: Self-pay | Admitting: General Practice

## 2017-06-10 DIAGNOSIS — E039 Hypothyroidism, unspecified: Secondary | ICD-10-CM

## 2017-06-10 MED ORDER — LEVOTHYROXINE SODIUM 125 MCG PO TABS: 125.0000 ug | ORAL_TABLET | Freq: Every day | ORAL | 3 refills | Status: DC

## 2017-07-08 ENCOUNTER — Other Ambulatory Visit (INDEPENDENT_AMBULATORY_CARE_PROVIDER_SITE_OTHER): Payer: BLUE CROSS/BLUE SHIELD

## 2017-07-08 DIAGNOSIS — E039 Hypothyroidism, unspecified: Secondary | ICD-10-CM

## 2017-07-08 LAB — TSH: TSH: 6.78 u[IU]/mL — ABNORMAL HIGH (ref 0.35–4.50)

## 2017-07-09 ENCOUNTER — Other Ambulatory Visit: Payer: Self-pay | Admitting: General Practice

## 2017-07-09 DIAGNOSIS — E039 Hypothyroidism, unspecified: Secondary | ICD-10-CM

## 2017-07-09 MED ORDER — LEVOTHYROXINE SODIUM 150 MCG PO TABS: 150.0000 ug | ORAL_TABLET | Freq: Every day | ORAL | 3 refills | Status: DC

## 2017-07-23 DIAGNOSIS — F502 Bulimia nervosa: Secondary | ICD-10-CM | POA: Diagnosis not present

## 2017-07-29 DIAGNOSIS — Z1231 Encounter for screening mammogram for malignant neoplasm of breast: Secondary | ICD-10-CM | POA: Diagnosis not present

## 2017-07-29 DIAGNOSIS — Z01419 Encounter for gynecological examination (general) (routine) without abnormal findings: Secondary | ICD-10-CM | POA: Diagnosis not present

## 2017-07-29 DIAGNOSIS — Z6820 Body mass index (BMI) 20.0-20.9, adult: Secondary | ICD-10-CM | POA: Diagnosis not present

## 2017-08-08 ENCOUNTER — Encounter: Payer: Self-pay | Admitting: General Practice

## 2017-08-08 ENCOUNTER — Other Ambulatory Visit (INDEPENDENT_AMBULATORY_CARE_PROVIDER_SITE_OTHER): Payer: BLUE CROSS/BLUE SHIELD

## 2017-08-08 DIAGNOSIS — E039 Hypothyroidism, unspecified: Secondary | ICD-10-CM | POA: Diagnosis not present

## 2017-08-08 LAB — TSH: TSH: 3.84 u[IU]/mL (ref 0.35–4.50)

## 2017-08-12 ENCOUNTER — Other Ambulatory Visit: Payer: BLUE CROSS/BLUE SHIELD

## 2017-09-20 LAB — HM PAP SMEAR

## 2017-09-20 LAB — HM MAMMOGRAPHY: HM MAMMO: NORMAL (ref 0–4)

## 2017-10-13 ENCOUNTER — Other Ambulatory Visit: Payer: Self-pay | Admitting: Family Medicine

## 2017-10-15 ENCOUNTER — Telehealth: Payer: Self-pay | Admitting: Family Medicine

## 2017-10-15 NOTE — Telephone Encounter (Signed)
Called and spoke with patient.  She is concerned about her TSH Levels.  Appointment made with Dr. Beverely Lowabori next week to discuss.

## 2017-10-15 NOTE — Telephone Encounter (Signed)
Copied from CRM [phone]. Topic: Quick Communication - See Telephone Encounter >> Oct 15, 2017  8:58 AM Katherine [name], Katherine [name] wrote: CRM for notification. See Telephone encounter for: 10/15/17. Patient called and would like to know if she needs an appt or if Dr. Beverely Lowabori can just put in a order for TSA to check level. Please call patient back.

## 2017-10-21 ENCOUNTER — Encounter: Payer: Self-pay | Admitting: Family Medicine

## 2017-10-21 ENCOUNTER — Ambulatory Visit (INDEPENDENT_AMBULATORY_CARE_PROVIDER_SITE_OTHER): Payer: BLUE CROSS/BLUE SHIELD | Admitting: Family Medicine

## 2017-10-21 ENCOUNTER — Other Ambulatory Visit: Payer: Self-pay

## 2017-10-21 VITALS — BP 112/72 | HR 62 | Temp 98.0°F | Resp 16 | Ht 60.0 in | Wt 124.0 lb

## 2017-10-21 DIAGNOSIS — E039 Hypothyroidism, unspecified: Secondary | ICD-10-CM | POA: Diagnosis not present

## 2017-10-21 DIAGNOSIS — N951 Menopausal and female climacteric states: Secondary | ICD-10-CM | POA: Diagnosis not present

## 2017-10-21 LAB — FOLLICLE STIMULATING HORMONE: FSH: 5.1 m[IU]/mL

## 2017-10-21 LAB — LUTEINIZING HORMONE: LH: 10.7 m[IU]/mL

## 2017-10-21 LAB — TSH: TSH: 0.23 u[IU]/mL — ABNORMAL LOW (ref 0.35–4.50)

## 2017-10-21 LAB — T3, FREE: T3, Free: 7.2 pg/mL — ABNORMAL HIGH (ref 2.3–4.2)

## 2017-10-21 LAB — T4, FREE: Free T4: 1.48 ng/dL (ref 0.60–1.60)

## 2017-10-21 NOTE — Assessment & Plan Note (Signed)
Pt continues to struggle w/ nightly hot flashes and fatigue.  She is on Loestrin daily for menopausal sxs but this doesn't seem to be controlling things as her sxs are more consistent w/ menopause than thyroid.  Check FSH/LH.  Add OTC Black Cohosh.  Will follow.

## 2017-10-21 NOTE — Assessment & Plan Note (Signed)
Chronic problem.  Pt reports nightly hot flashes and fatigue (which may be related).  Check labs to determine if pt needs med adjustment

## 2017-10-21 NOTE — Patient Instructions (Addendum)
Follow up as needed or as scheduled We'll notify you of your lab results and make any changes if needed Add Black Cohosh before bed to see if this improves symptoms Call with any questions or concerns Hang in there!!!

## 2017-10-21 NOTE — Progress Notes (Signed)
   Subjective:    Patient ID: Katherine [name], female    DOB: 07/15/1975, 42 y.o.   MRN: [id number]  HPI Hypothyroid- chronic problem, on daily.  Pt reports she is having night sweats.  Gyn recommended thyroid recheck.  Night sweats are occurring nightly.  Currently on OCPs per GYN for hot flashes- takes this daily.  Continues to struggle w/ fatigue.   Review of Systems For ROS see HPI     Objective:   Physical Exam  Constitutional: She is oriented to person, place, and time. She appears well-developed and well-nourished. No distress.  HENT:  Head: Normocephalic and atraumatic.  Neck: Normal range of motion. Neck supple. No thyromegaly present.  Neurological: She is alert and oriented to person, place, and time.  Skin: Skin is warm and dry.  Psychiatric: She has a normal mood and affect. Her behavior is normal. Thought content normal.  Vitals reviewed.         Assessment & Plan:

## 2017-10-22 ENCOUNTER — Other Ambulatory Visit: Payer: Self-pay | Admitting: General Practice

## 2017-10-22 DIAGNOSIS — E039 Hypothyroidism, unspecified: Secondary | ICD-10-CM

## 2017-10-22 MED ORDER — LEVOTHYROXINE SODIUM 125 MCG PO TABS: 125.0000 ug | ORAL_TABLET | Freq: Every day | ORAL | 3 refills | Status: DC

## 2017-11-19 ENCOUNTER — Encounter: Payer: Self-pay | Admitting: Family Medicine

## 2017-11-20 ENCOUNTER — Other Ambulatory Visit: Payer: Self-pay

## 2017-11-20 ENCOUNTER — Ambulatory Visit (INDEPENDENT_AMBULATORY_CARE_PROVIDER_SITE_OTHER): Payer: BLUE CROSS/BLUE SHIELD | Admitting: Family Medicine

## 2017-11-20 ENCOUNTER — Encounter: Payer: Self-pay | Admitting: Family Medicine

## 2017-11-20 VITALS — BP 114/80 | HR 63 | Temp 98.7°F | Resp 15 | Ht 60.0 in | Wt 121.0 lb

## 2017-11-20 DIAGNOSIS — Z23 Encounter for immunization: Secondary | ICD-10-CM

## 2017-11-20 DIAGNOSIS — S91332A Puncture wound without foreign body, left foot, initial encounter: Secondary | ICD-10-CM | POA: Diagnosis not present

## 2017-11-20 DIAGNOSIS — E039 Hypothyroidism, unspecified: Secondary | ICD-10-CM | POA: Diagnosis not present

## 2017-11-20 LAB — T4, FREE: FREE T4: 0.96 ng/dL (ref 0.60–1.60)

## 2017-11-20 LAB — T3, FREE: T3, Free: 4.3 pg/mL — ABNORMAL HIGH (ref 2.3–4.2)

## 2017-11-20 LAB — TSH: TSH: 2.75 u[IU]/mL (ref 0.35–4.50)

## 2017-11-20 NOTE — Progress Notes (Signed)
   Subjective:    Patient ID: Katherine [name], female    DOB: Mar 08, 1976, 42 y.o.   MRN: [id number]  HPI Puncture wound- stepped on a piece of metal fencing on Sunday and has puncture wound to L foot.  Due for Tdap.  'it doesn't look bad' but pt reports pain w/ wearing shoes other than flip flops and weight bearing.  Pt was 'pulling bits of shoe' out of the wound.  Hypothyroid- ongoing issue for pt, she is due for repeat labs today.  Review of Systems For ROS see HPI     Objective:   Physical Exam  Constitutional: She appears well-developed and well-nourished. No distress.  Cardiovascular: Intact distal pulses.  Musculoskeletal: She exhibits no edema or tenderness.  Skin: Skin is warm and dry.  <0.5 cm puncture to ball of L foot.  No swelling, discharge, surrounding erythema or induration  Vitals reviewed.         Assessment & Plan:  Puncture wound- new.  Pt stepped on piece of metal fencing on Sunday.  Has cleaned wound and pulled debris (flip flop) from wound.  No current erythema or induration.  Mild TTP.  No drainage or evidence of infxn.  Tdap updated.  Reviewed supportive care and red flags that should prompt return.  Pt expressed understanding and is in agreement w/ plan.

## 2017-11-20 NOTE — Patient Instructions (Signed)
Follow up as needed or as scheduled If area develops increased redness, swelling, or pus- let me know so we can send in antibiotics Keep area clean and covered to allow healing The pain should continue to improve Call with any questions or concerns Have a great summer!!

## 2018-01-22 ENCOUNTER — Other Ambulatory Visit: Payer: Self-pay | Admitting: Family Medicine

## 2018-01-22 DIAGNOSIS — F502 Bulimia nervosa: Secondary | ICD-10-CM | POA: Diagnosis not present

## 2018-03-18 ENCOUNTER — Ambulatory Visit (INDEPENDENT_AMBULATORY_CARE_PROVIDER_SITE_OTHER): Payer: BLUE CROSS/BLUE SHIELD | Admitting: Family Medicine

## 2018-03-18 ENCOUNTER — Other Ambulatory Visit: Payer: Self-pay

## 2018-03-18 ENCOUNTER — Encounter: Payer: Self-pay | Admitting: Family Medicine

## 2018-03-18 ENCOUNTER — Ambulatory Visit (INDEPENDENT_AMBULATORY_CARE_PROVIDER_SITE_OTHER): Payer: BLUE CROSS/BLUE SHIELD

## 2018-03-18 VITALS — BP 116/78 | HR 89 | Temp 98.1°F | Resp 16 | Ht 60.0 in | Wt 121.2 lb

## 2018-03-18 DIAGNOSIS — M79672 Pain in left foot: Secondary | ICD-10-CM

## 2018-03-18 DIAGNOSIS — S91332A Puncture wound without foreign body, left foot, initial encounter: Secondary | ICD-10-CM | POA: Diagnosis not present

## 2018-03-18 DIAGNOSIS — Z23 Encounter for immunization: Secondary | ICD-10-CM | POA: Diagnosis not present

## 2018-03-18 NOTE — Patient Instructions (Signed)
Go to our [facility] ([address]) for your xray We'll notify you of your results We'll call you with your podiatry appt Continue to wear cushioned shoes until your appt Call with any questions or concerns Hang in there!!!

## 2018-03-18 NOTE — Progress Notes (Signed)
   Subjective:    Patient ID: Katherine [name], female    DOB: 11/15/1975, 42 y.o.   MRN: [id number]  HPI Foot pain- L foot, stepped on a piece of metal fencing in late May that punctured both her shoe and her foot.  Pain initially resolved and was doing well until she went to the beach in August and was walking on the sand.  Now notices a 'bump' when she is at the gym and does certain movements.  No redness, warmth, drainage.   Review of Systems For ROS see HPI     Objective:   Physical Exam  Constitutional: She is oriented to person, place, and time. She appears well-developed and well-nourished. No distress.  Cardiovascular: Intact distal pulses.  Neurological: She is alert and oriented to person, place, and time.  Skin: Skin is warm and dry. No rash noted. No erythema.  Small, firm area on ball of 1st metatarsal that is mildly TTP w/ surrounding callous present  Psychiatric: She has a normal mood and affect. Her behavior is normal. Thought content normal.  Vitals reviewed.         Assessment & Plan:  Foot pain- new.  Pt either has foreign body or corn (likely triggered by scar tissue).  Will get xray to assess for foreign body and refer to Podiatry for definitive treatment.  Pt expressed understanding and is in agreement w/ plan.

## 2018-03-20 ENCOUNTER — Encounter: Payer: Self-pay | Admitting: Family Medicine

## 2018-04-06 ENCOUNTER — Ambulatory Visit: Payer: BLUE CROSS/BLUE SHIELD | Admitting: Podiatry

## 2018-04-16 DIAGNOSIS — R2242 Localized swelling, mass and lump, left lower limb: Secondary | ICD-10-CM | POA: Diagnosis not present

## 2018-04-16 DIAGNOSIS — Q6672 Congenital pes cavus, left foot: Secondary | ICD-10-CM | POA: Diagnosis not present

## 2018-04-16 DIAGNOSIS — M79672 Pain in left foot: Secondary | ICD-10-CM | POA: Diagnosis not present

## 2018-04-16 DIAGNOSIS — Z9889 Other specified postprocedural states: Secondary | ICD-10-CM | POA: Diagnosis not present

## 2018-04-22 ENCOUNTER — Ambulatory Visit: Payer: BLUE CROSS/BLUE SHIELD | Admitting: Podiatry

## 2018-06-10 ENCOUNTER — Encounter: Payer: Self-pay | Admitting: Family Medicine

## 2018-06-10 ENCOUNTER — Other Ambulatory Visit: Payer: Self-pay

## 2018-06-10 ENCOUNTER — Other Ambulatory Visit: Payer: Self-pay | Admitting: Family Medicine

## 2018-06-10 ENCOUNTER — Ambulatory Visit (INDEPENDENT_AMBULATORY_CARE_PROVIDER_SITE_OTHER): Payer: BLUE CROSS/BLUE SHIELD | Admitting: Family Medicine

## 2018-06-10 VITALS — BP 121/81 | HR 57 | Temp 98.1°F | Resp 16 | Ht 60.0 in | Wt 128.2 lb

## 2018-06-10 DIAGNOSIS — Z0000 Encounter for general adult medical examination without abnormal findings: Secondary | ICD-10-CM

## 2018-06-10 DIAGNOSIS — E039 Hypothyroidism, unspecified: Secondary | ICD-10-CM | POA: Diagnosis not present

## 2018-06-10 LAB — BASIC METABOLIC PANEL
BUN: 13 mg/dL (ref 6–23)
CALCIUM: 8.8 mg/dL (ref 8.4–10.5)
CO2: 24 meq/L (ref 19–32)
CREATININE: 0.99 mg/dL (ref 0.40–1.20)
Chloride: 106 mEq/L (ref 96–112)
GFR: 64.49 mL/min (ref >60.00)
Glucose, Bld: 84 mg/dL (ref 70–99)
Potassium: 4.1 mEq/L (ref 3.5–5.1)
SODIUM: 137 meq/L (ref 135–145)

## 2018-06-10 LAB — TSH: TSH: 4.06 u[IU]/mL (ref 0.35–4.50)

## 2018-06-10 LAB — CBC WITH DIFFERENTIAL/PLATELET
Basophils Absolute: 0 10*3/uL (ref 0.0–0.1)
Basophils Relative: 0.7 % (ref 0.0–3.0)
Eosinophils Absolute: 0.1 10*3/uL (ref 0.0–0.7)
Eosinophils Relative: 2.5 % (ref 0.0–5.0)
HCT: 41.5 % (ref 36.0–46.0)
HEMOGLOBIN: 14 g/dL (ref 12.0–15.0)
Lymphocytes Relative: 23.5 % (ref 12.0–46.0)
Lymphs Abs: 1.3 10*3/uL (ref 0.7–4.0)
MCHC: 33.7 g/dL (ref 30.0–36.0)
MCV: 93.8 fl (ref 78.0–100.0)
MONO ABS: 0.3 10*3/uL (ref 0.1–1.0)
Monocytes Relative: 6.1 % (ref 3.0–12.0)
Neutro Abs: 3.8 10*3/uL (ref 1.4–7.7)
Neutrophils Relative %: 67.2 % (ref 43.0–77.0)
Platelets: 236 10*3/uL (ref 150.0–400.0)
RBC: 4.43 Mil/uL (ref 3.87–5.11)
RDW: 12.7 % (ref 11.5–15.5)
WBC: 5.7 10*3/uL (ref 4.0–10.5)

## 2018-06-10 LAB — LIPID PANEL
Cholesterol: 268 mg/dL — ABNORMAL HIGH (ref 0–200)
HDL: 93.4 mg/dL (ref >39.00)
LDL Cholesterol: 153 mg/dL — ABNORMAL HIGH (ref 0–99)
NonHDL: 174.8
Total CHOL/HDL Ratio: 3
Triglycerides: 109 mg/dL (ref 0.0–149.0)
VLDL: 21.8 mg/dL (ref 0.0–40.0)

## 2018-06-10 LAB — HEPATIC FUNCTION PANEL
ALT: 12 U/L (ref 0–35)
AST: 16 U/L (ref 0–37)
Albumin: 4.1 g/dL (ref 3.5–5.2)
Alkaline Phosphatase: 29 U/L — ABNORMAL LOW (ref 39–117)
BILIRUBIN DIRECT: 0 mg/dL (ref 0.0–0.3)
Total Bilirubin: 0.4 mg/dL (ref 0.2–1.2)
Total Protein: 6.7 g/dL (ref 6.0–8.3)

## 2018-06-10 NOTE — Assessment & Plan Note (Signed)
Pt has hx of this.  Currently asymptomatic.  Check labs.  Adjust meds prn.

## 2018-06-10 NOTE — Assessment & Plan Note (Signed)
Pt's PE WNL.  UTD on pap, mammo, immunizations.  Check labs.  Anticipatory guidance provided.  

## 2018-06-10 NOTE — Progress Notes (Signed)
   Subjective:    Patient ID: Katherine [name], female    DOB: 01/04/1976, 42 y.o.   MRN: [id number]  HPI CPE- UTD on pap, mammo, Tdap, Flu.   Review of Systems Patient reports no vision/ hearing changes, adenopathy,fever, weight change,  persistant/recurrent hoarseness , swallowing issues, chest pain, palpitations, edema, persistant/recurrent cough, hemoptysis, dyspnea (rest/exertional/paroxysmal nocturnal), gastrointestinal bleeding (melena, rectal bleeding), abdominal pain, significant heartburn, bowel changes, GU symptoms (dysuria, hematuria, incontinence), Gyn symptoms (abnormal  bleeding, pain),  syncope, focal weakness, memory loss, numbness & tingling, skin/hair/nail changes, abnormal bruising or bleeding, anxiety, or depression.     Objective:   Physical Exam General Appearance:    Alert, cooperative, no distress, appears stated age  Head:    Normocephalic, without obvious abnormality, atraumatic  Eyes:    PERRL, conjunctiva/corneas clear, EOM's intact, fundi    benign, both eyes  Ears:    Normal TM's and external ear canals, both ears  Nose:   Nares normal, septum midline, mucosa normal, no drainage    or sinus tenderness  Throat:   Lips, mucosa, and tongue normal; teeth and gums normal  Neck:   Supple, symmetrical, trachea midline, no adenopathy;    Thyroid: no enlargement/tenderness/nodules  Back:     Symmetric, no curvature, ROM normal, no CVA tenderness  Lungs:     Clear to auscultation bilaterally, respirations unlabored  Chest Wall:    No tenderness or deformity   Heart:    Regular rate and rhythm, S1 and S2 normal, no murmur, rub   or gallop  Breast Exam:    Deferred to GYN  Abdomen:     Soft, non-tender, bowel sounds active all four quadrants,    no masses, no organomegaly  Genitalia:    Deferred to GYN  Rectal:    Extremities:   Extremities normal, atraumatic, no cyanosis or edema  Pulses:   2+ and symmetric all extremities  Skin:   Skin color, texture, turgor normal,  no rashes or lesions  Lymph nodes:   Cervical, supraclavicular, and axillary nodes normal  Neurologic:   CNII-XII intact, normal strength, sensation and reflexes    throughout          Assessment & Plan:

## 2018-06-10 NOTE — Patient Instructions (Signed)
Follow up in 1 year or as needed We'll notify you of your lab results and make any changes if needed Continue to work on healthy diet and regular exercise- you look great! Call with any questions or concerns Happy Holidays!!! 

## 2018-07-06 ENCOUNTER — Other Ambulatory Visit: Payer: Self-pay | Admitting: Family Medicine

## 2018-07-09 ENCOUNTER — Ambulatory Visit (INDEPENDENT_AMBULATORY_CARE_PROVIDER_SITE_OTHER): Payer: BLUE CROSS/BLUE SHIELD | Admitting: Psychiatry

## 2018-07-09 ENCOUNTER — Encounter: Payer: Self-pay | Admitting: Psychiatry

## 2018-07-09 ENCOUNTER — Ambulatory Visit: Payer: Self-pay | Admitting: Psychiatry

## 2018-07-09 VITALS — BP 148/93 | HR 87

## 2018-07-09 DIAGNOSIS — F5081 Binge eating disorder: Secondary | ICD-10-CM | POA: Diagnosis not present

## 2018-07-09 DIAGNOSIS — F411 Generalized anxiety disorder: Secondary | ICD-10-CM

## 2018-07-09 MED ORDER — LISDEXAMFETAMINE DIMESYLATE 70 MG PO CAPS: 70.0000 mg | ORAL_CAPSULE | Freq: Every day | ORAL | 0 refills | Status: DC

## 2018-07-09 MED ORDER — LISDEXAMFETAMINE DIMESYLATE 60 MG PO CAPS: 60.0000 mg | ORAL_CAPSULE | ORAL | 0 refills | Status: DC

## 2018-07-09 NOTE — Progress Notes (Signed)
Katherine [name] [id number] 12/12/1975 43 y.o.  Subjective:   Patient ID:  Katherine Paperi L Crull is a 43 y.o. (DOB 09/27/1975) female.  Chief Complaint:  Chief Complaint  Patient presents with  . Follow-up    Medication Management    HPI  Last visit August Katherine Paperi L Braaksma presents to the office today for follow-up of  EDO and mood. And stress.  Chronic stress with Louis.  He failed to work at public school socially, so had to send him to boarding school Jan 1.  He tends to do better at school than at home.  Can't get a handle on his emotions and social skills.  He's 45 yo.   It's hard for her emotionally letting go.  No concerns about the meds.  Vyvanse helps but wonders if she' more tolerant to it.  Can't take more topomax without cog problems.  Still binge eats.  Generally not depressed.  Function is okay.  Still gets anxious and stressed but has triggers for that.  Sleep affected by stress and don't want sleep meds.  No new meds.  Review of Systems:  Review of Systems  Neurological: Negative for tremors and weakness.  Psychiatric/Behavioral: Negative for agitation, behavioral problems, confusion, decreased concentration, dysphoric mood, hallucinations, self-injury, sleep disturbance and suicidal ideas. The patient is not nervous/anxious and is not hyperactive.     Medications: I have reviewed the patient's current medications.  Current Outpatient Medications  Medication Sig Dispense Refill  . BIOTIN PO Take 1 tablet by mouth daily.     Marland Kitchen. CALCIUM PO Take 1 tablet by mouth daily.     . cholecalciferol (VITAMIN D3) 25 MCG (1000 UT) tablet Take 1,000 Units by mouth daily.    Marland Kitchen. ibuprofen (ADVIL,MOTRIN) 200 MG tablet Take 600 mg by mouth every 6 (six) hours as needed for moderate pain.    Marland Kitchen. levothyroxine (SYNTHROID, LEVOTHROID) 125 MCG tablet TAKE 1 TABLET BY MOUTH ONCE DAILY 90 tablet 1  . [START ON 08/06/2018] lisdexamfetamine (VYVANSE) 60 MG capsule Take 1 capsule (60 mg total) by mouth every  morning. 30 capsule 0  . lisdexamfetamine (VYVANSE) 70 MG capsule Take 1 capsule (70 mg total) by mouth daily. 30 capsule 0  . loratadine (CLARITIN) 10 MG tablet Take 10 mg by mouth daily.    . norethindrone-ethinyl estradiol (MICROGESTIN,JUNEL,LOESTRIN) 1-20 MG-MCG tablet   3  . rizatriptan (MAXALT) 10 MG tablet TAKE 1 TABLET BY MOUTH ONCE DAILY AS NEEDED 10 tablet 0  . sertraline (ZOLOFT) 100 MG tablet   3  . topiramate (TOPAMAX) 50 MG tablet Take 50 mg by mouth 2 (two) times daily.     . valACYclovir (VALTREX) 500 MG tablet Take 500 mg by mouth 2 (two) times daily as needed (outbreak).     Melene Muller. [START ON 09/03/2018] lisdexamfetamine (VYVANSE) 70 MG capsule Take 1 capsule (70 mg total) by mouth daily. 30 capsule 0   No current facility-administered medications for this visit.     Medication Side Effects: None  Allergies: No Known Allergies  Past Medical History:  Diagnosis Date  . Migraine   . Unspecified hypothyroidism     Family History  Problem Relation Age of Onset  . Alcohol abuse Mother   . Cancer Mother        lung  . Heart disease Mother   . Hypertension Mother   . Hyperlipidemia Father   . Heart disease Father   . Hypertension Father   . Diabetes Father   . Cancer  Other   . Hypertension Other   . Hyperlipidemia Other   . Heart attack Other   . Stroke Other     Social History   Socioeconomic History  . Marital status: Married    Spouse name: Not on file  . Number of children: 1  . Years of education: Not on file  . Highest education level: Not on file  Occupational History    Employer: UNEMPLOYED  Social Needs  . Financial resource strain: Not on file  . Food insecurity:    Worry: Not on file    Inability: Not on file  . Transportation needs:    Medical: Not on file    Non-medical: Not on file  Tobacco Use  . Smoking status: Former Games developer  . Smokeless tobacco: Never Used  Substance and Sexual Activity  . Alcohol use: Yes    Comment: occasionally  wine  . Drug use: No  . Sexual activity: Not on file  Lifestyle  . Physical activity:    Days per week: Not on file    Minutes per session: Not on file  . Stress: Not on file  Relationships  . Social connections:    Talks on phone: Not on file    Gets together: Not on file    Attends religious service: Not on file    Active member of club or organization: Not on file    Attends meetings of clubs or organizations: Not on file    Relationship status: Not on file  . Intimate partner violence:    Fear of current or ex partner: Not on file    Emotionally abused: Not on file    Physically abused: Not on file    Forced sexual activity: Not on file  Other Topics Concern  . Not on file  Social History Narrative  . Not on file    Past Medical History, Surgical history, Social history, and Family history were reviewed and updated as appropriate.   Please see review of systems for further details on the patient's review from today.   Objective:   Physical Exam:  BP (!) 148/93 (BP Location: Left Arm)   Pulse 87   Physical Exam Constitutional:      General: She is not in acute distress.    Appearance: She is well-developed.  Musculoskeletal:        General: No deformity.  Neurological:     Mental Status: She is alert and oriented to person, place, and time.     Motor: No tremor.     Coordination: Coordination normal.     Gait: Gait normal.  Psychiatric:        Attention and Perception: Attention and perception normal.        Mood and Affect: Mood is not anxious or depressed. Affect is not labile, blunt, angry or inappropriate.        Speech: Speech normal.        Behavior: Behavior normal.        Thought Content: Thought content normal. Thought content does not include homicidal or suicidal ideation. Thought content does not include homicidal or suicidal plan.        Cognition and Memory: Cognition normal.        Judgment: Judgment normal.     Comments: Insight intact. No  auditory or visual hallucinations. No delusions.      Lab Review:     Component Value Date/Time   NA 137 06/10/2018 0832   K 4.1  06/10/2018 0832   CL 106 06/10/2018 0832   CO2 24 06/10/2018 0832   GLUCOSE 84 06/10/2018 0832   BUN 13 06/10/2018 0832   CREATININE 0.99 06/10/2018 0832   CALCIUM 8.8 06/10/2018 0832   PROT 6.7 06/10/2018 0832   ALBUMIN 4.1 06/10/2018 0832   AST 16 06/10/2018 0832   ALT 12 06/10/2018 0832   ALKPHOS 29 (L) 06/10/2018 0832   BILITOT 0.4 06/10/2018 0832   GFRNONAA >60 01/10/2015 1910   GFRAA >60 01/10/2015 1910       Component Value Date/Time   WBC 5.7 06/10/2018 0832   RBC 4.43 06/10/2018 0832   HGB 14.0 06/10/2018 0832   HCT 41.5 06/10/2018 0832   PLT 236.0 06/10/2018 0832   MCV 93.8 06/10/2018 0832   MCH 30.8 01/10/2015 1910   MCHC 33.7 06/10/2018 0832   RDW 12.7 06/10/2018 0832   LYMPHSABS 1.3 06/10/2018 0832   MONOABS 0.3 06/10/2018 0832   EOSABS 0.1 06/10/2018 0832   BASOSABS 0.0 06/10/2018 0832    No results found for: POCLITH, LITHIUM   No results found for: PHENYTOIN, PHENOBARB, VALPROATE, CBMZ   .res Assessment: Plan:    Generalized anxiety disorder  Binge eating disorder - Plan: lisdexamfetamine (VYVANSE) 60 MG capsule, lisdexamfetamine (VYVANSE) 70 MG capsule, lisdexamfetamine (VYVANSE) 70 MG capsule  Supportive therapy dealing with son.  Unlikely other meds for EDO would help.  The 2 best are Vyvanse and topiramate.  No indication for med change.  Vyvanse does help her with the binge eating but it does not solve the problem.  She has had counseling.  She alternates between 60 and 70 mg.  At times to 70 mg seems to be a little too high making her feel jittery.  The other reason she alternates is to reduce the risk of tolerance.  Anxiety is generally manageable with the sertraline it clearly helps.  Couldn't tolerate NAC DT heartburn.  May try again with tablets.  This will be off label for the binge eating  compulsiveness.  FU 6 mos  Meredith Staggers, MD, DFAPA  Please see After Visit Summary for patient specific instructions.  Future Appointments  Date Time Provider Department Center  06/16/2019  8:00 AM Sheliah Hatch, MD [facility]    No orders of the defined types were placed in this encounter.     -------------------------------

## 2018-07-13 ENCOUNTER — Other Ambulatory Visit: Payer: Self-pay | Admitting: Family Medicine

## 2018-07-15 DIAGNOSIS — L720 Epidermal cyst: Secondary | ICD-10-CM | POA: Diagnosis not present

## 2018-07-15 DIAGNOSIS — R2242 Localized swelling, mass and lump, left lower limb: Secondary | ICD-10-CM | POA: Diagnosis not present

## 2018-08-25 DIAGNOSIS — Z1231 Encounter for screening mammogram for malignant neoplasm of breast: Secondary | ICD-10-CM | POA: Diagnosis not present

## 2018-08-25 DIAGNOSIS — Z6821 Body mass index (BMI) 21.0-21.9, adult: Secondary | ICD-10-CM | POA: Diagnosis not present

## 2018-08-25 DIAGNOSIS — Z01419 Encounter for gynecological examination (general) (routine) without abnormal findings: Secondary | ICD-10-CM | POA: Diagnosis not present

## 2018-09-17 ENCOUNTER — Telehealth: Payer: Self-pay | Admitting: Psychiatry

## 2018-09-17 NOTE — Telephone Encounter (Signed)
Pt has one on file per records.  Check with pharmacy, ok to fill 09/03/2018

## 2018-09-17 NOTE — Telephone Encounter (Signed)
Patient need refill on Vyvanse 70 mg., send to [facility] on [address].

## 2018-09-24 ENCOUNTER — Telehealth: Payer: Self-pay | Admitting: Psychiatry

## 2018-09-24 ENCOUNTER — Other Ambulatory Visit: Payer: Self-pay

## 2018-09-24 MED ORDER — TOPIRAMATE 50 MG PO TABS: 50.0000 mg | ORAL_TABLET | Freq: Two times a day (BID) | ORAL | 2 refills | Status: DC

## 2018-09-24 MED ORDER — SERTRALINE HCL 100 MG PO TABS: 100.0000 mg | ORAL_TABLET | Freq: Every day | ORAL | 2 refills | Status: DC

## 2018-09-24 NOTE — Telephone Encounter (Signed)
Pt called checking status on refill request from [facility] about a week ago for ZOLOFT 100 mg and TOPAMAX 50 mg

## 2018-09-24 NOTE — Telephone Encounter (Signed)
Request last week was for Vyvanse, will send other refills if appropriate

## 2018-11-17 ENCOUNTER — Telehealth: Payer: Self-pay | Admitting: Psychiatry

## 2018-11-17 ENCOUNTER — Other Ambulatory Visit: Payer: Self-pay

## 2018-11-17 DIAGNOSIS — F5081 Binge eating disorder: Secondary | ICD-10-CM

## 2018-11-17 MED ORDER — LISDEXAMFETAMINE DIMESYLATE 70 MG PO CAPS: 70.0000 mg | ORAL_CAPSULE | Freq: Every day | ORAL | 0 refills | Status: DC

## 2018-11-17 MED ORDER — SERTRALINE HCL 100 MG PO TABS: 200.0000 mg | ORAL_TABLET | Freq: Every day | ORAL | 0 refills | Status: DC

## 2018-11-17 NOTE — Telephone Encounter (Signed)
Patient called and said that she needs a refill on her vyvanse 60 mg to be sent to [facility]. She also needs a new script of zoloft 100 mg sent in as well. She says she takes 2 pills a day and usually gets more than a 30 day supply.

## 2018-11-17 NOTE — Telephone Encounter (Signed)
Refills submitted.  

## 2018-12-27 ENCOUNTER — Other Ambulatory Visit: Payer: Self-pay | Admitting: Family Medicine

## 2018-12-28 ENCOUNTER — Telehealth: Payer: Self-pay | Admitting: Psychiatry

## 2018-12-28 NOTE — Telephone Encounter (Signed)
Already has one on file at pharmacy she can fill

## 2018-12-28 NOTE — Telephone Encounter (Signed)
Pt needs refill on Vyvanse 60mg  sent to [facility].

## 2019-01-07 ENCOUNTER — Ambulatory Visit: Payer: BLUE CROSS/BLUE SHIELD | Admitting: Psychiatry

## 2019-01-19 ENCOUNTER — Encounter: Payer: Self-pay | Admitting: Psychiatry

## 2019-01-19 ENCOUNTER — Ambulatory Visit (INDEPENDENT_AMBULATORY_CARE_PROVIDER_SITE_OTHER): Payer: BC Managed Care – PPO | Admitting: Psychiatry

## 2019-01-19 ENCOUNTER — Other Ambulatory Visit: Payer: Self-pay

## 2019-01-19 VITALS — BP 142/93 | HR 83

## 2019-01-19 DIAGNOSIS — F411 Generalized anxiety disorder: Secondary | ICD-10-CM

## 2019-01-19 DIAGNOSIS — F5081 Binge eating disorder: Secondary | ICD-10-CM

## 2019-01-19 MED ORDER — LISDEXAMFETAMINE DIMESYLATE 60 MG PO CAPS: 60.0000 mg | ORAL_CAPSULE | ORAL | 0 refills | Status: DC

## 2019-01-19 MED ORDER — LISDEXAMFETAMINE DIMESYLATE 70 MG PO CAPS: 70.0000 mg | ORAL_CAPSULE | Freq: Every day | ORAL | 0 refills | Status: DC

## 2019-01-19 MED ORDER — TOPIRAMATE 50 MG PO TABS: 50.0000 mg | ORAL_TABLET | Freq: Two times a day (BID) | ORAL | 1 refills | Status: DC

## 2019-01-19 MED ORDER — SERTRALINE HCL 100 MG PO TABS: 200.0000 mg | ORAL_TABLET | Freq: Every day | ORAL | 1 refills | Status: DC

## 2019-01-19 NOTE — Progress Notes (Signed)
Katherine [name] [id number] [birth year] 43 y.o.  Subjective:   [name] is a 43 y.o. (DOB [birth year]) female.  Chief Complaint:  Chief Complaint  Patient presents with  . Follow-up    Medication Management  . Depression    Medication Management    Depression        Associated symptoms include no decreased concentration and no suicidal ideas.  Katherine [name] presents to the office today for follow-up of  EDO and mood. And stress.  Last seen in January.  No meds were changed.   Doing Ok overall.  Katherine [name] school has stayed open.  He did develop Covid and recovered quickly with mild sx.    Chronic stress with Katherine [name].  He failed to work at [employer] socially, so had to send him to boarding school Jan 1.  He tends to do better at school than at home.  Can't get a handle on his emotions and social skills.  He's 43 yo.   She still struggles with some depression chronically.  Managing OK.  Can't go to gym which helped her mood.  Handling better than she did.    No concerns about the meds.  Vyvanse helps but wonders if she' more tolerant to it.  Can't take more topomax without cog problems.  Still binge eats.  Generally not depressed.  Function is okay.  Still gets anxious and stressed but has triggers for that.  Sleep affected by stress and don't want sleep meds.  No new meds.  Review of Systems:  Review of Systems  Neurological: Negative for tremors and weakness.  Psychiatric/Behavioral: Positive for depression. Negative for agitation, behavioral problems, confusion, decreased concentration, dysphoric mood, hallucinations, self-injury, sleep disturbance and suicidal ideas. The patient is not nervous/anxious and is not hyperactive.     Medications: I have reviewed the patient's current medications.  Current Outpatient Medications  Medication Sig Dispense Refill  . BIOTIN PO Take 1 tablet by mouth daily.     Marland Kitchen CALCIUM PO Take 1 tablet by mouth daily.     . cholecalciferol  (VITAMIN D3) 25 MCG (1000 UT) tablet Take 1,000 Units by mouth daily.    Marland Kitchen ibuprofen (ADVIL,MOTRIN) 200 MG tablet Take 600 mg by mouth every 6 (six) hours as needed for moderate pain.    Marland Kitchen levothyroxine (SYNTHROID) 125 MCG tablet Take 1 tablet by mouth once daily 90 tablet 0  . lisdexamfetamine (VYVANSE) 60 MG capsule Take 1 capsule (60 mg total) by mouth every morning. 30 capsule 0  . lisdexamfetamine (VYVANSE) 70 MG capsule Take 1 capsule (70 mg total) by mouth daily. 30 capsule 0  . lisdexamfetamine (VYVANSE) 70 MG capsule Take 1 capsule (70 mg total) by mouth daily. 30 capsule 0  . loratadine (CLARITIN) 10 MG tablet Take 10 mg by mouth daily.    . norethindrone-ethinyl estradiol (MICROGESTIN,JUNEL,LOESTRIN) 1-20 MG-MCG tablet   3  . rizatriptan (MAXALT) 10 MG tablet TAKE 1 TABLET BY MOUTH ONCE DAILY AS NEEDED 10 tablet 5  . sertraline (ZOLOFT) 100 MG tablet Take 2 tablets (200 mg total) by mouth daily. 180 tablet 0  . topiramate (TOPAMAX) 50 MG tablet Take 1 tablet (50 mg total) by mouth 2 (two) times daily. 60 tablet 2  . valACYclovir (VALTREX) 500 MG tablet Take 500 mg by mouth 2 (two) times daily as needed (outbreak).      No current facility-administered medications for this visit.     Medication Side Effects: None  Allergies: No Known Allergies  Past Medical History:  Diagnosis Date  . Migraine   . Unspecified hypothyroidism     Family History  Problem Relation Age of Onset  . Alcohol abuse Mother   . Cancer Mother        lung  . Heart disease Mother   . Hypertension Mother   . Hyperlipidemia Father   . Heart disease Father   . Hypertension Father   . Diabetes Father   . Cancer Other   . Hypertension Other   . Hyperlipidemia Other   . Heart attack Other   . Stroke Other     Social History   Socioeconomic History  . Marital status: Married    Spouse name: Not on file  . Number of children: 1  . Years of education: Not on file  . Highest education level: Not  on file  Occupational History    Employer: UNEMPLOYED  Social Needs  . Financial resource strain: Not on file  . Food insecurity    Worry: Not on file    Inability: Not on file  . Transportation needs    Medical: Not on file    Non-medical: Not on file  Tobacco Use  . Smoking status: Former Games developermoker  . Smokeless tobacco: Never Used  Substance and Sexual Activity  . Alcohol use: Yes    Comment: occasionally wine  . Drug use: No  . Sexual activity: Not on file  Lifestyle  . Physical activity    Days per week: Not on file    Minutes per session: Not on file  . Stress: Not on file  Relationships  . Social Musicianconnections    Talks on phone: Not on file    Gets together: Not on file    Attends religious service: Not on file    Active member of club or organization: Not on file    Attends meetings of clubs or organizations: Not on file    Relationship status: Not on file  . Intimate partner violence    Fear of current or ex partner: Not on file    Emotionally abused: Not on file    Physically abused: Not on file    Forced sexual activity: Not on file  Other Topics Concern  . Not on file  Social History Narrative  . Not on file    Past Medical History, Surgical history, Social history, and Family history were reviewed and updated as appropriate.   Please see review of systems for further details on the patient's review from today.   Objective:   Physical Exam:  BP (!) 142/93   Pulse 83   Physical Exam Constitutional:      General: She is not in acute distress.    Appearance: She is well-developed.  Musculoskeletal:        General: No deformity.  Neurological:     Mental Status: She is alert and oriented to person, place, and time.     Motor: No tremor.     Coordination: Coordination normal.     Gait: Gait normal.  Psychiatric:        Attention and Perception: Attention and perception normal.        Mood and Affect: Mood is not anxious or depressed. Affect is not  labile, blunt, angry or inappropriate.        Speech: Speech normal.        Behavior: Behavior normal.        Thought Content: Thought  content normal. Thought content does not include homicidal or suicidal ideation. Thought content does not include homicidal or suicidal plan.        Cognition and Memory: Cognition normal.        Judgment: Judgment normal.     Comments: Insight intact. No auditory or visual hallucinations. No delusions.      Lab Review:     Component Value Date/Time   NA 137 06/10/2018 0832   K 4.1 06/10/2018 0832   CL 106 06/10/2018 0832   CO2 24 06/10/2018 0832   GLUCOSE 84 06/10/2018 0832   BUN 13 06/10/2018 0832   CREATININE 0.99 06/10/2018 0832   CALCIUM 8.8 06/10/2018 0832   PROT 6.7 06/10/2018 0832   ALBUMIN 4.1 06/10/2018 0832   AST 16 06/10/2018 0832   ALT 12 06/10/2018 0832   ALKPHOS 29 (L) 06/10/2018 0832   BILITOT 0.4 06/10/2018 0832   GFRNONAA >60 01/10/2015 1910   GFRAA >60 01/10/2015 1910       Component Value Date/Time   WBC 5.7 06/10/2018 0832   RBC 4.43 06/10/2018 0832   HGB 14.0 06/10/2018 0832   HCT 41.5 06/10/2018 0832   PLT 236.0 06/10/2018 0832   MCV 93.8 06/10/2018 0832   MCH 30.8 01/10/2015 1910   MCHC 33.7 06/10/2018 0832   RDW 12.7 06/10/2018 0832   LYMPHSABS 1.3 06/10/2018 0832   MONOABS 0.3 06/10/2018 0832   EOSABS 0.1 06/10/2018 0832   BASOSABS 0.0 06/10/2018 0832    No results found for: POCLITH, LITHIUM   No results found for: PHENYTOIN, PHENOBARB, VALPROATE, CBMZ   .res Assessment: Plan:    Barth Kirkseri was seen today for follow-up and depression.  Diagnoses and all orders for this visit:  Generalized anxiety disorder  Binge eating disorder    Supportive therapy dealing with son and COVID and being isolated.  Unlikely other meds for EDO would help.  The 2 best are Vyvanse and topiramate.  No indication for med change.  Vyvanse does help her with the binge eating but it does not solve the problem.  She has  had counseling.  She alternates between 60 and 70 mg.  At times to 70 mg seems to be a little too high making her feel jittery.  The other reason she alternates is to reduce the risk of tolerance.  Anxiety is generally manageable with the sertraline it clearly helps.  Couldn't tolerate NAC DT heartburn.  May try again with tablets.  This will be off label for the binge eating compulsiveness.  FU 6 mos  Meredith Staggersarey Cottle, MD, DFAPA  Please see After Visit Summary for patient specific instructions.  Future Appointments  Date Time Provider Department Center  06/16/2019  8:00 AM Sheliah Hatchabori, Katherine E, MD [facility]    No orders of the defined types were placed in this encounter.     -------------------------------

## 2019-01-27 ENCOUNTER — Other Ambulatory Visit: Payer: Self-pay | Admitting: Family Medicine

## 2019-02-26 ENCOUNTER — Other Ambulatory Visit: Payer: Self-pay | Admitting: Family Medicine

## 2019-05-01 ENCOUNTER — Other Ambulatory Visit: Payer: Self-pay | Admitting: Family Medicine

## 2019-05-18 ENCOUNTER — Other Ambulatory Visit: Payer: Self-pay | Admitting: Family Medicine

## 2019-05-24 DIAGNOSIS — G43009 Migraine without aura, not intractable, without status migrainosus: Secondary | ICD-10-CM | POA: Diagnosis not present

## 2019-05-31 ENCOUNTER — Other Ambulatory Visit: Payer: Self-pay

## 2019-05-31 ENCOUNTER — Telehealth: Payer: Self-pay | Admitting: Psychiatry

## 2019-05-31 DIAGNOSIS — F5081 Binge eating disorder: Secondary | ICD-10-CM

## 2019-05-31 MED ORDER — LISDEXAMFETAMINE DIMESYLATE 70 MG PO CAPS: 70.0000 mg | ORAL_CAPSULE | Freq: Every day | ORAL | 0 refills | Status: DC

## 2019-05-31 MED ORDER — LISDEXAMFETAMINE DIMESYLATE 60 MG PO CAPS: 60.0000 mg | ORAL_CAPSULE | ORAL | 0 refills | Status: DC

## 2019-05-31 NOTE — Telephone Encounter (Signed)
Last refill 70 mg 04/01/2019 60 mg 03/02/2019 Pended for approval

## 2019-05-31 NOTE — Telephone Encounter (Signed)
Katherine [name] called to request refill of her Vvyanse 70mg  and 60mg .  Appt 07/22/19.  Send to [facility].

## 2019-06-03 ENCOUNTER — Other Ambulatory Visit: Payer: Self-pay | Admitting: Family Medicine

## 2019-06-16 ENCOUNTER — Ambulatory Visit (INDEPENDENT_AMBULATORY_CARE_PROVIDER_SITE_OTHER): Payer: BC Managed Care – PPO | Admitting: Family Medicine

## 2019-06-16 ENCOUNTER — Other Ambulatory Visit: Payer: Self-pay

## 2019-06-16 ENCOUNTER — Encounter: Payer: Self-pay | Admitting: Family Medicine

## 2019-06-16 VITALS — HR 74 | Temp 98.7°F | Ht 60.0 in | Wt 124.0 lb

## 2019-06-16 DIAGNOSIS — E039 Hypothyroidism, unspecified: Secondary | ICD-10-CM | POA: Diagnosis not present

## 2019-06-16 DIAGNOSIS — Z0000 Encounter for general adult medical examination without abnormal findings: Secondary | ICD-10-CM | POA: Diagnosis not present

## 2019-06-16 NOTE — Progress Notes (Signed)
I have discussed the procedure for the virtual visit with the patient who has given consent to proceed with assessment and treatment.   Andilyn Bettcher S Erinn Mendosa, CMA     

## 2019-06-16 NOTE — Progress Notes (Signed)
Virtual Visit via Video   I connected with patient on 06/16/19 at  8:00 AM EST by a video enabled telemedicine application and verified that I am speaking with the correct person using two identifiers.  Location patient: Home Location provider: Acup[facility]urist, Office Persons participating in the virtual visit: Patient, Provider, [place] (Patina M)  I discussed the limitations of evaluation and management by telemedicine and the availability of in person appointments. The patient expressed understanding and agreed to proceed.  Subjective:   HPI:   CPE- Due for flu shot and mammogram (pt needs to schedule).  UTD on pap and Tdap.  ROS:  Patient reports no vision/ hearing changes, adenopathy,fever, weight change,  persistant/recurrent hoarseness , swallowing issues, chest pain, palpitations, edema, persistant/recurrent cough, hemoptysis, dyspnea (rest/exertional/paroxysmal nocturnal), gastrointestinal bleeding (melena, rectal bleeding), abdominal pain, significant heartburn, bowel changes, GU symptoms (dysuria, hematuria, incontinence), Gyn symptoms (abnormal  bleeding, pain),  syncope, focal weakness, memory loss, numbness & tingling, skin/hair/nail changes, abnormal bruising or bleeding, anxiety, or depression.   Patient Active Problem List   Diagnosis Date Noted  . Menopausal symptoms 10/21/2017  . Physical exam 06/08/2014  . Depression 10/07/2013  . Migraines 10/07/2013  . Nail abnormality 10/07/2013  . Palpitations 01/09/2011  . Pain in the chest 01/09/2011  . Hypothyroidism 06/06/2009  . HYPERCHOLESTEROLEMIA 06/06/2009    Social History   Tobacco Use  . Smoking status: Former Research scientist (life sciences)  . Smokeless tobacco: Never Used  Substance Use Topics  . Alcohol use: Yes    Comment: occasionally wine    Current Outpatient Medications:  .  BIOTIN PO, Take 1 tablet by mouth daily. , Disp: , Rfl:  .  CALCIUM PO, Take 1 tablet by mouth daily. , Disp: , Rfl:  .  cholecalciferol  (VITAMIN D3) 25 MCG (1000 UT) tablet, Take 1,000 Units by mouth daily., Disp: , Rfl:  .  ibuprofen (ADVIL,MOTRIN) 200 MG tablet, Take 600 mg by mouth every 6 (six) hours as needed for moderate pain., Disp: , Rfl:  .  levothyroxine (SYNTHROID) 125 MCG tablet, Take 1 tablet by mouth once daily, Disp: 90 tablet, Rfl: 0 .  lisdexamfetamine (VYVANSE) 60 MG capsule, Take 1 capsule (60 mg total) by mouth every morning., Disp: 30 capsule, Rfl: 0 .  lisdexamfetamine (VYVANSE) 60 MG capsule, Take 1 capsule (60 mg total) by mouth every morning., Disp: 30 capsule, Rfl: 0 .  lisdexamfetamine (VYVANSE) 70 MG capsule, Take 1 capsule (70 mg total) by mouth daily., Disp: 30 capsule, Rfl: 0 .  lisdexamfetamine (VYVANSE) 70 MG capsule, Take 1 capsule (70 mg total) by mouth daily., Disp: 30 capsule, Rfl: 0 .  loratadine (CLARITIN) 10 MG tablet, Take 10 mg by mouth daily., Disp: , Rfl:  .  norethindrone-ethinyl estradiol (MICROGESTIN,JUNEL,LOESTRIN) 1-20 MG-MCG tablet, , Disp: , Rfl: 3 .  rizatriptan (MAXALT) 10 MG tablet, TAKE 1 TABLET BY MOUTH ONCE DAILY AS NEEDED, Disp: 10 tablet, Rfl: 0 .  sertraline (ZOLOFT) 100 MG tablet, Take 2 tablets (200 mg total) by mouth daily., Disp: 180 tablet, Rfl: 1 .  topiramate (TOPAMAX) 50 MG tablet, Take 1 tablet (50 mg total) by mouth 2 (two) times daily., Disp: 180 tablet, Rfl: 1 .  valACYclovir (VALTREX) 500 MG tablet, Take 500 mg by mouth 2 (two) times daily as needed (outbreak). , Disp: , Rfl:   No Known Allergies  Objective:   Pulse 74   Temp 98.7 F (37.1 C) (Temporal)   Ht 5' (1.524 m)   Wt 124  lb (56.2 kg)   BMI 24.22 kg/m   AAOx3, NAD NCAT, EOMI No obvious CN deficits Coloring WNL Pt is able to speak clearly, coherently without shortness of breath or increased work of breathing.  Thought process is linear.  Mood is appropriate.   Assessment and Plan:   CPE- UTD on pap, Tdap.  Due for flu and mammo.  Pt will get flu when she comes for labs, will schedule  mammo w/ [facility].  Limited video PE WNL.  Check labs.  Anticipatory guidance provided.    Neena Rhymes, MD 06/16/2019

## 2019-06-22 ENCOUNTER — Encounter: Payer: Self-pay | Admitting: Family Medicine

## 2019-06-22 ENCOUNTER — Ambulatory Visit (INDEPENDENT_AMBULATORY_CARE_PROVIDER_SITE_OTHER): Payer: BC Managed Care – PPO

## 2019-06-22 ENCOUNTER — Other Ambulatory Visit: Payer: Self-pay

## 2019-06-22 DIAGNOSIS — Z23 Encounter for immunization: Secondary | ICD-10-CM

## 2019-06-22 DIAGNOSIS — E039 Hypothyroidism, unspecified: Secondary | ICD-10-CM | POA: Diagnosis not present

## 2019-06-22 LAB — HEPATIC FUNCTION PANEL
ALT: 11 U/L (ref 0–35)
AST: 17 U/L (ref 0–37)
Albumin: 4.1 g/dL (ref 3.5–5.2)
Alkaline Phosphatase: 31 U/L — ABNORMAL LOW (ref 39–117)
Bilirubin, Direct: 0.1 mg/dL (ref 0.0–0.3)
Total Bilirubin: 0.5 mg/dL (ref 0.2–1.2)
Total Protein: 6.5 g/dL (ref 6.0–8.3)

## 2019-06-22 LAB — CBC WITH DIFFERENTIAL/PLATELET
Basophils Absolute: 0.1 10*3/uL (ref 0.0–0.1)
Basophils Relative: 1.2 % (ref 0.0–3.0)
Eosinophils Absolute: 0.2 10*3/uL (ref 0.0–0.7)
Eosinophils Relative: 3.9 % (ref 0.0–5.0)
HCT: 41.8 % (ref 36.0–46.0)
Hemoglobin: 14 g/dL (ref 12.0–15.0)
Lymphocytes Relative: 32.7 % (ref 12.0–46.0)
Lymphs Abs: 1.6 10*3/uL (ref 0.7–4.0)
MCHC: 33.5 g/dL (ref 30.0–36.0)
MCV: 93.9 fl (ref 78.0–100.0)
Monocytes Absolute: 0.4 10*3/uL (ref 0.1–1.0)
Monocytes Relative: 7.9 % (ref 3.0–12.0)
Neutro Abs: 2.6 10*3/uL (ref 1.4–7.7)
Neutrophils Relative %: 54.3 % (ref 43.0–77.0)
Platelets: 242 10*3/uL (ref 150.0–400.0)
RBC: 4.46 Mil/uL (ref 3.87–5.11)
RDW: 12.9 % (ref 11.5–15.5)
WBC: 4.8 10*3/uL (ref 4.0–10.5)

## 2019-06-22 LAB — BASIC METABOLIC PANEL
BUN: 15 mg/dL (ref 6–23)
CO2: 27 mEq/L (ref 19–32)
Calcium: 9 mg/dL (ref 8.4–10.5)
Chloride: 102 mEq/L (ref 96–112)
Creatinine, Ser: 0.96 mg/dL (ref 0.40–1.20)
GFR: 62.58 mL/min (ref >60.00)
Glucose, Bld: 80 mg/dL (ref 70–99)
Potassium: 3.8 mEq/L (ref 3.5–5.1)
Sodium: 136 mEq/L (ref 135–145)

## 2019-06-22 LAB — LIPID PANEL
Cholesterol: 275 mg/dL — ABNORMAL HIGH (ref 0–200)
HDL: 94.3 mg/dL (ref >39.00)
LDL Cholesterol: 159 mg/dL — ABNORMAL HIGH (ref 0–99)
NonHDL: 181.1
Total CHOL/HDL Ratio: 3
Triglycerides: 110 mg/dL (ref 0.0–149.0)
VLDL: 22 mg/dL (ref 0.0–40.0)

## 2019-06-22 LAB — TSH: TSH: 6 u[IU]/mL — ABNORMAL HIGH (ref 0.35–4.50)

## 2019-06-23 ENCOUNTER — Other Ambulatory Visit: Payer: Self-pay | Admitting: General Practice

## 2019-06-23 DIAGNOSIS — E039 Hypothyroidism, unspecified: Secondary | ICD-10-CM

## 2019-06-23 MED ORDER — LEVOTHYROXINE SODIUM 137 MCG PO TABS: 137.0000 ug | ORAL_TABLET | Freq: Every day | ORAL | 3 refills | Status: DC

## 2019-07-22 ENCOUNTER — Encounter: Payer: Self-pay | Admitting: Psychiatry

## 2019-07-22 ENCOUNTER — Ambulatory Visit (INDEPENDENT_AMBULATORY_CARE_PROVIDER_SITE_OTHER): Payer: BC Managed Care – PPO | Admitting: Psychiatry

## 2019-07-22 ENCOUNTER — Other Ambulatory Visit: Payer: Self-pay

## 2019-07-22 DIAGNOSIS — F5105 Insomnia due to other mental disorder: Secondary | ICD-10-CM

## 2019-07-22 DIAGNOSIS — F411 Generalized anxiety disorder: Secondary | ICD-10-CM

## 2019-07-22 DIAGNOSIS — F5081 Binge eating disorder: Secondary | ICD-10-CM

## 2019-07-22 MED ORDER — LISDEXAMFETAMINE DIMESYLATE 70 MG PO CAPS: 70.0000 mg | ORAL_CAPSULE | Freq: Every day | ORAL | 0 refills | Status: DC

## 2019-07-22 MED ORDER — TOPIRAMATE 50 MG PO TABS: 50.0000 mg | ORAL_TABLET | Freq: Two times a day (BID) | ORAL | 1 refills | Status: DC

## 2019-07-22 MED ORDER — LISDEXAMFETAMINE DIMESYLATE 60 MG PO CAPS: 60.0000 mg | ORAL_CAPSULE | ORAL | 0 refills | Status: DC

## 2019-07-22 MED ORDER — TRAZODONE HCL 50 MG PO TABS: 50.0000 mg | ORAL_TABLET | Freq: Every day | ORAL | 0 refills | Status: DC

## 2019-07-22 MED ORDER — SERTRALINE HCL 100 MG PO TABS: 200.0000 mg | ORAL_TABLET | Freq: Every day | ORAL | 1 refills | Status: DC

## 2019-07-22 NOTE — Progress Notes (Signed)
Katherine [name] [id number] 07-22-1975 44 y.o.  Subjective:   [name] is a 44 y.o. (DOB 02/21/1976) female.  Chief Complaint:  Chief Complaint  Patient presents with  . Follow-up    Medication Managment  . Anxiety    Medication Managment    Depression        Associated symptoms include no decreased concentration, no headaches and no suicidal ideas.  Past medical history includes anxiety.   Anxiety Patient reports no confusion, decreased concentration, nervous/anxious behavior or suicidal ideas.     Katherine [name] presents to the office today for follow-up of  EDO and mood. And stress.  Last seen in July.  No meds were changed.   Doing Ok overall.  Stays away from the news and that helps.  Healthy Covid free.  [employer] school has stayed open. First time they'd ever seen any improvement.  Best Xmas ever.   He did develop Covid in summer and recovered quickly with mild sx.  School is not talking about him coming home anytime soon.   She wants Ed to get therapy to help him handle [place] better.   less struggles with some depression chronically.  Managing OK.  Can't go to gym which helped her mood.  Handling better than she did.   Some trouble sleeping with EMA and start thinking about even silly things.  Lately average 4 hours sleep 4-5 nights/week for 6 months.   Less exercise bc Covid and exercise always helped it.  No concerns about the meds.  Vyvanse helps but wonders if she' more tolerant to it.  Can't take more topomax without cog problems.  Still binge eats.  Generally not depressed.  Function is okay.  Still gets anxious and stressed but has triggers for that.  Tolerates meds.  No change in binge eating pattern.  Sleep affected by stress and don't want sleep meds.  No new meds.  Past Psychiatric Medication Trials: NAC capsules heartburn,    Review of Systems:  Review of Systems  Neurological: Negative for tremors, weakness and headaches.  Psychiatric/Behavioral:  Positive for depression. Negative for agitation, behavioral problems, confusion, decreased concentration, dysphoric mood, hallucinations, self-injury, sleep disturbance and suicidal ideas. The patient is not nervous/anxious and is not hyperactive.     Medications: I have reviewed the patient's current medications.  Current Outpatient Medications  Medication Sig Dispense Refill  . BIOTIN PO Take 1 tablet by mouth daily.     Marland Kitchen CALCIUM PO Take 1 tablet by mouth daily.     . cholecalciferol (VITAMIN D3) 25 MCG (1000 UT) tablet Take 1,000 Units by mouth daily.    Marland Kitchen ibuprofen (ADVIL,MOTRIN) 200 MG tablet Take 600 mg by mouth every 6 (six) hours as needed for moderate pain.    Marland Kitchen levothyroxine (SYNTHROID) 137 MCG tablet Take 1 tablet (137 mcg total) by mouth daily before breakfast. 30 tablet 3  . lisdexamfetamine (VYVANSE) 60 MG capsule Take 1 capsule (60 mg total) by mouth every morning. 30 capsule 0  . lisdexamfetamine (VYVANSE) 60 MG capsule Take 1 capsule (60 mg total) by mouth every morning. 30 capsule 0  . lisdexamfetamine (VYVANSE) 70 MG capsule Take 1 capsule (70 mg total) by mouth daily. 30 capsule 0  . [START ON 08/19/2019] lisdexamfetamine (VYVANSE) 70 MG capsule Take 1 capsule (70 mg total) by mouth daily. 30 capsule 0  . loratadine (CLARITIN) 10 MG tablet Take 10 mg by mouth daily.    . norethindrone-ethinyl estradiol (MICROGESTIN,JUNEL,LOESTRIN)  1-20 MG-MCG tablet   3  . rizatriptan (MAXALT) 10 MG tablet TAKE 1 TABLET BY MOUTH ONCE DAILY AS NEEDED 10 tablet 0  . sertraline (ZOLOFT) 100 MG tablet Take 2 tablets (200 mg total) by mouth daily. 180 tablet 1  . topiramate (TOPAMAX) 50 MG tablet Take 1 tablet (50 mg total) by mouth 2 (two) times daily. 180 tablet 1  . valACYclovir (VALTREX) 500 MG tablet Take 500 mg by mouth 2 (two) times daily as needed (outbreak).     . traZODone (DESYREL) 50 MG tablet Take 1 tablet (50 mg total) by mouth at bedtime. 90 tablet 0   No current  facility-administered medications for this visit.    Medication Side Effects: None  Allergies: No Known Allergies  Past Medical History:  Diagnosis Date  . Migraine   . Unspecified hypothyroidism     Family History  Problem Relation Age of Onset  . Alcohol abuse Mother   . Cancer Mother        lung  . Heart disease Mother   . Hypertension Mother   . Hyperlipidemia Father   . Heart disease Father   . Hypertension Father   . Diabetes Father   . Cancer Other   . Hypertension Other   . Hyperlipidemia Other   . Heart attack Other   . Stroke Other     Social History   Socioeconomic History  . Marital status: Married    Spouse name: Not on file  . Number of children: 1  . Years of education: Not on file  . Highest education level: Not on file  Occupational History    Employer: UNEMPLOYED  Tobacco Use  . Smoking status: Former Research scientist (life sciences)  . Smokeless tobacco: Never Used  Substance and Sexual Activity  . Alcohol use: Yes    Comment: occasionally wine  . Drug use: No  . Sexual activity: Not on file  Other Topics Concern  . Not on file  Social History Narrative  . Not on file   Social Determinants of Health   Financial Resource Strain:   . Difficulty of Paying Living Expenses: Not on file  Food Insecurity:   . Worried About Charity fundraiser in the Last Year: Not on file  . Ran Out of Food in the Last Year: Not on file  Transportation Needs:   . Lack of Transportation (Medical): Not on file  . Lack of Transportation (Non-Medical): Not on file  Physical Activity:   . Days of Exercise per Week: Not on file  . Minutes of Exercise per Session: Not on file  Stress:   . Feeling of Stress : Not on file  Social Connections:   . Frequency of Communication with Friends and Family: Not on file  . Frequency of Social Gatherings with Friends and Family: Not on file  . Attends Religious Services: Not on file  . Active Member of Clubs or Organizations: Not on file  .  Attends Archivist Meetings: Not on file  . Marital Status: Not on file  Intimate Partner Violence:   . Fear of Current or Ex-Partner: Not on file  . Emotionally Abused: Not on file  . Physically Abused: Not on file  . Sexually Abused: Not on file    Past Medical History, Surgical history, Social history, and Family history were reviewed and updated as appropriate.   Please see review of systems for further details on the patient's review from today.   Objective:  Physical Exam:  There were no vitals taken for this visit.  Physical Exam Constitutional:      General: She is not in acute distress.    Appearance: She is well-developed.  Musculoskeletal:        General: No deformity.  Neurological:     Mental Status: She is alert and oriented to person, place, and time.     Motor: No tremor.     Coordination: Coordination normal.     Gait: Gait normal.  Psychiatric:        Attention and Perception: Attention and perception normal.        Mood and Affect: Mood is anxious. Mood is not depressed. Affect is not labile, blunt, angry or inappropriate.        Speech: Speech normal.        Behavior: Behavior normal.        Thought Content: Thought content normal. Thought content does not include homicidal or suicidal ideation. Thought content does not include homicidal or suicidal plan.        Cognition and Memory: Cognition normal.        Judgment: Judgment normal.     Comments: Insight intact. No auditory or visual hallucinations. No delusions.      Lab Review:     Component Value Date/Time   NA 136 06/22/2019 0804   K 3.8 06/22/2019 0804   CL 102 06/22/2019 0804   CO2 27 06/22/2019 0804   GLUCOSE 80 06/22/2019 0804   BUN 15 06/22/2019 0804   CREATININE 0.96 06/22/2019 0804   CALCIUM 9.0 06/22/2019 0804   PROT 6.5 06/22/2019 0804   ALBUMIN 4.1 06/22/2019 0804   AST 17 06/22/2019 0804   ALT 11 06/22/2019 0804   ALKPHOS 31 (L) 06/22/2019 0804   BILITOT 0.5  06/22/2019 0804   GFRNONAA >60 01/10/2015 1910   GFRAA >60 01/10/2015 1910       Component Value Date/Time   WBC 4.8 06/22/2019 0804   RBC 4.46 06/22/2019 0804   HGB 14.0 06/22/2019 0804   HCT 41.8 06/22/2019 0804   PLT 242.0 06/22/2019 0804   MCV 93.9 06/22/2019 0804   MCH 30.8 01/10/2015 1910   MCHC 33.5 06/22/2019 0804   RDW 12.9 06/22/2019 0804   LYMPHSABS 1.6 06/22/2019 0804   MONOABS 0.4 06/22/2019 0804   EOSABS 0.2 06/22/2019 0804   BASOSABS 0.1 06/22/2019 0804    No results found for: POCLITH, LITHIUM   No results found for: PHENYTOIN, PHENOBARB, VALPROATE, CBMZ   .res Assessment: Plan:    Katherine [name] was seen today for follow-up and anxiety.  Diagnoses and all orders for this visit:  Generalized anxiety disorder -     topiramate (TOPAMAX) 50 MG tablet; Take 1 tablet (50 mg total) by mouth 2 (two) times daily. -     sertraline (ZOLOFT) 100 MG tablet; Take 2 tablets (200 mg total) by mouth daily.  Binge eating disorder -     lisdexamfetamine (VYVANSE) 70 MG capsule; Take 1 capsule (70 mg total) by mouth daily. -     lisdexamfetamine (VYVANSE) 60 MG capsule; Take 1 capsule (60 mg total) by mouth every morning. -     lisdexamfetamine (VYVANSE) 70 MG capsule; Take 1 capsule (70 mg total) by mouth daily. -     topiramate (TOPAMAX) 50 MG tablet; Take 1 tablet (50 mg total) by mouth 2 (two) times daily. -     sertraline (ZOLOFT) 100 MG tablet; Take 2 tablets (200 mg total) by  mouth daily.  Insomnia due to mental condition -     traZODone (DESYREL) 50 MG tablet; Take 1 tablet (50 mg total) by mouth at bedtime.    Supportive therapy dealing with son and COVID and being isolated.  Unlikely other meds for EDO would help.  The 2 best are Vyvanse and topiramate.  No indication for med change.  Vyvanse does help her with the binge eating but it does not solve the problem.  She has had counseling.  She alternates between 60 and 70 mg.  At times to 70 mg seems to be a little too  high making her feel jittery.  The other reason she alternates is to reduce the risk of tolerance.   Anxiety is generally manageable with the sertraline it clearly helps.  Couldn't tolerate NAC DT heartburn.  May try again with tablets.  This will be off label for the binge eating compulsiveness.  Disc insomnia as risk factor for mood poor impulse control with binge eating, cognitive risks. Trazodone 50-100 trial for sleep.  Disc SE  FU 6 mos   Meredith Staggers, MD, DFAPA  Please see After Visit Summary for patient specific instructions.  Future Appointments  Date Time Provider Department Center  07/26/2019  9:00 AM [facility]    No orders of the defined types were placed in this encounter.     -------------------------------

## 2019-07-26 ENCOUNTER — Other Ambulatory Visit: Payer: Self-pay

## 2019-07-26 ENCOUNTER — Other Ambulatory Visit: Payer: Self-pay | Admitting: General Practice

## 2019-07-26 ENCOUNTER — Ambulatory Visit (INDEPENDENT_AMBULATORY_CARE_PROVIDER_SITE_OTHER): Payer: BC Managed Care – PPO

## 2019-07-26 DIAGNOSIS — E039 Hypothyroidism, unspecified: Secondary | ICD-10-CM

## 2019-07-26 LAB — TSH: TSH: 0.93 u[IU]/mL (ref 0.35–4.50)

## 2019-07-26 MED ORDER — LEVOTHYROXINE SODIUM 137 MCG PO TABS: 137.0000 ug | ORAL_TABLET | Freq: Every day | ORAL | 1 refills | Status: DC

## 2019-08-09 IMAGING — DX DG FOOT 2V*L*
2 series · 2 of 2 positions shown · non-contrast
Comparison: None.

CLINICAL DATA: Plantar foot pain at the site of a puncture wound
that occurred in October 2017. This is between the distal aspects of the
1st and 2nd metatarsals. Clinical concern for a foreign body.

EXAM:
LEFT FOOT - 2 VIEW

[foot dp]
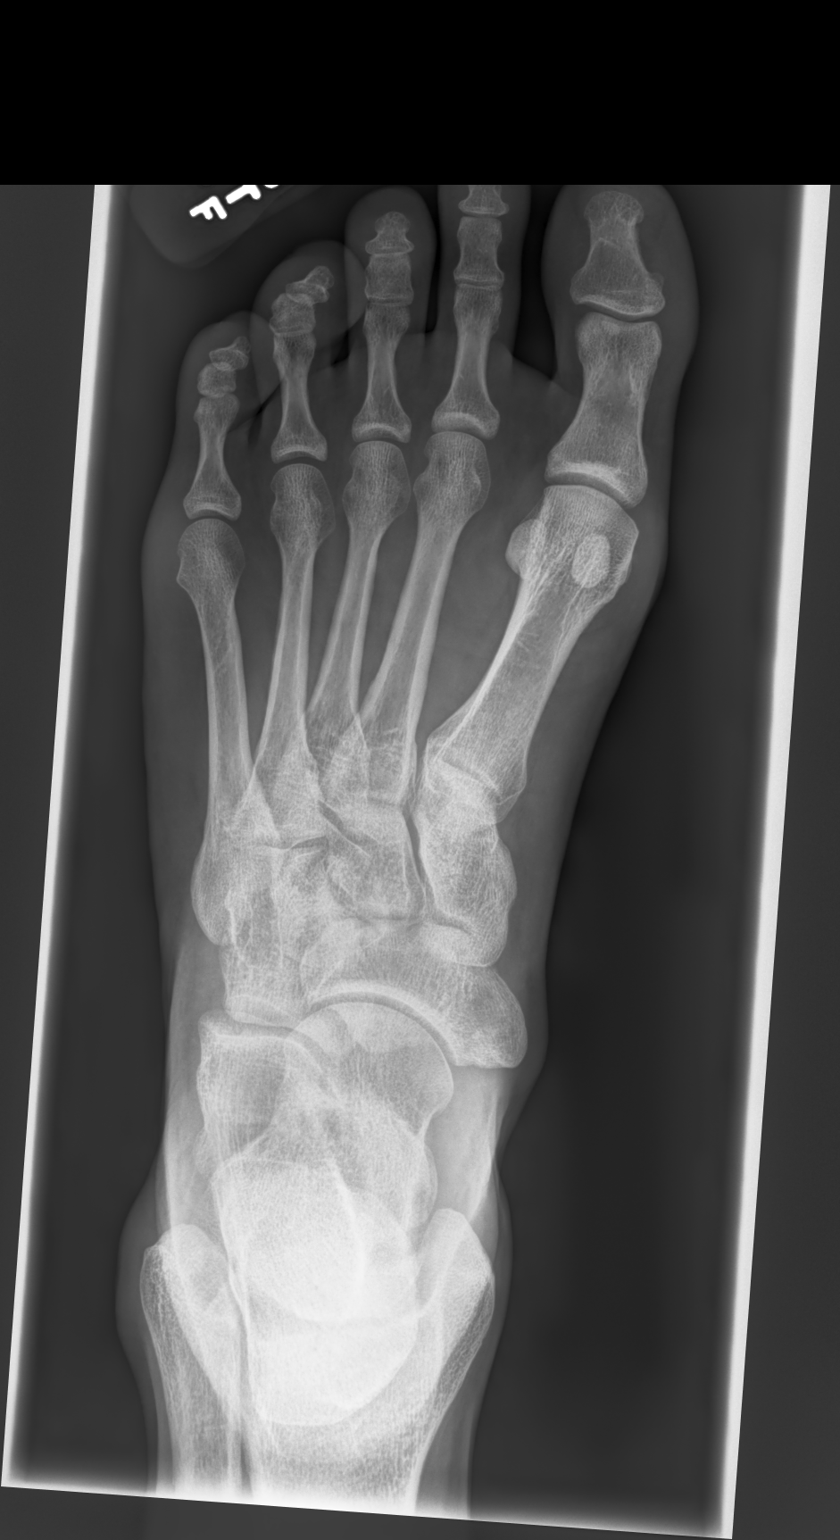

[foot lat]
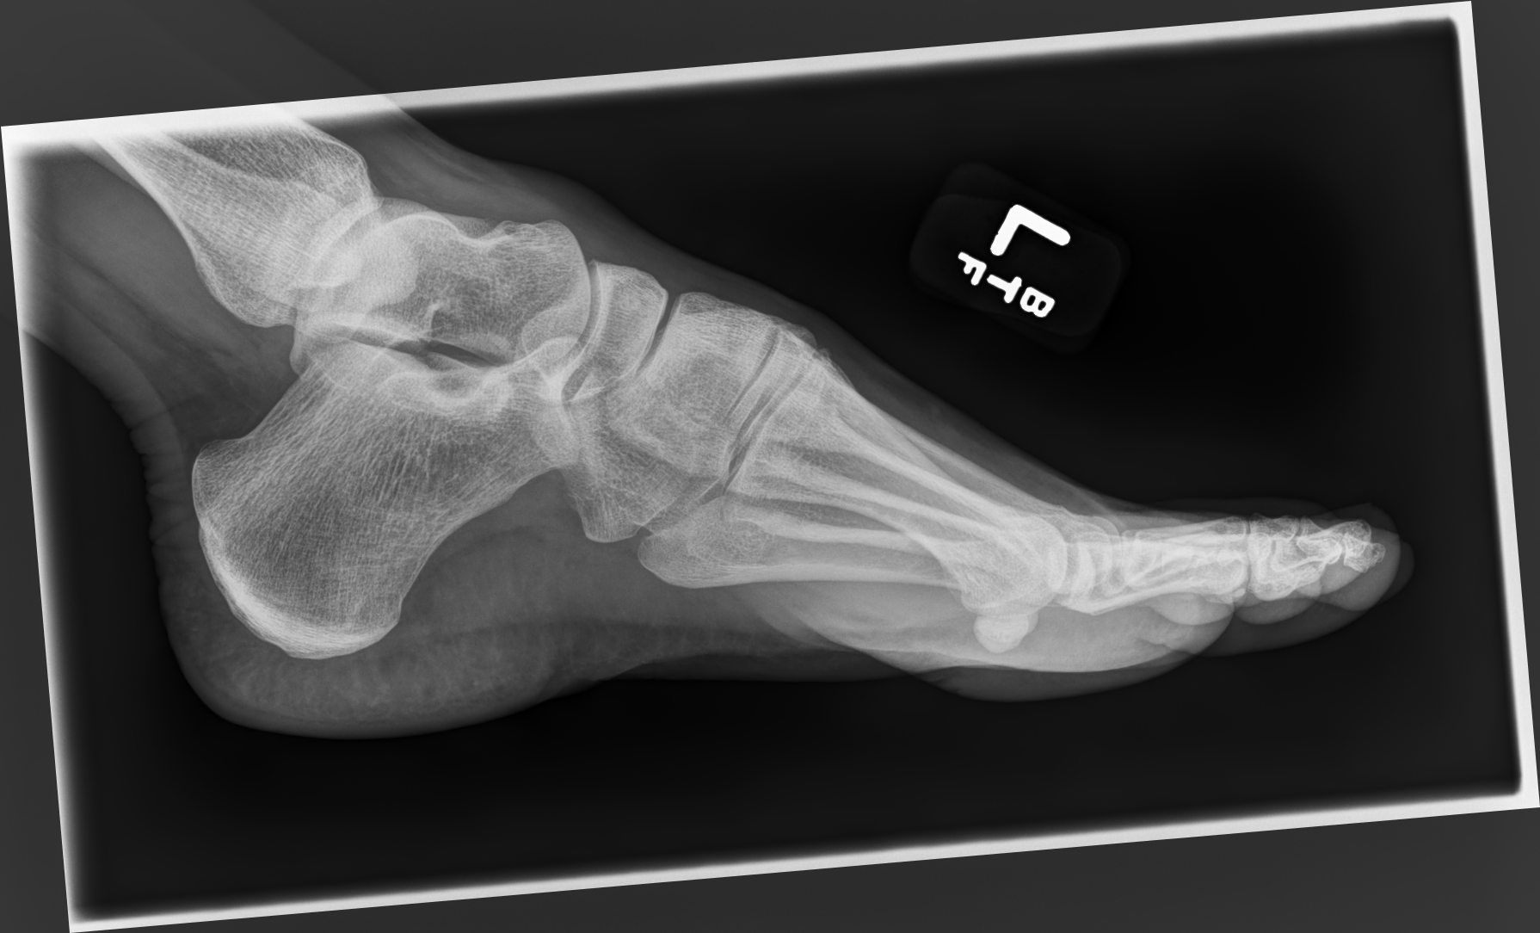

[2 of 2 positions shown; findings below may reference images not displayed]

FINDINGS: There is no evidence of fracture or dislocation. There is no
evidence of arthropathy or other focal bone abnormality. Soft
tissues are unremarkable.
IMPRESSION: Normal examination. No fracture or radiopaque foreign body seen on
two views.

## 2019-09-17 ENCOUNTER — Other Ambulatory Visit: Payer: Self-pay

## 2019-09-17 ENCOUNTER — Telehealth: Payer: Self-pay | Admitting: Psychiatry

## 2019-09-17 DIAGNOSIS — F5081 Binge eating disorder: Secondary | ICD-10-CM

## 2019-09-17 MED ORDER — LISDEXAMFETAMINE DIMESYLATE 60 MG PO CAPS: 60.0000 mg | ORAL_CAPSULE | ORAL | 0 refills | Status: DC

## 2019-09-17 MED ORDER — LISDEXAMFETAMINE DIMESYLATE 70 MG PO CAPS: 70.0000 mg | ORAL_CAPSULE | Freq: Every day | ORAL | 0 refills | Status: DC

## 2019-09-17 NOTE — Telephone Encounter (Signed)
Katherine [name] called to request refill of her Vyvanse.  Appt 01/19/20.  Send to [facility]

## 2019-09-17 NOTE — Telephone Encounter (Signed)
Patient alternates with Vyvanse 70 mg and 60 mg, pended 2 RX's of each strength for Dr. Jennelle Human to submit.

## 2019-10-18 ENCOUNTER — Telehealth: Payer: Self-pay | Admitting: Psychiatry

## 2019-10-18 NOTE — Telephone Encounter (Signed)
Patient already has both doses of Vyvanse on file at [facility]

## 2019-10-18 NOTE — Telephone Encounter (Signed)
Pt needs refill on VYVANSE. Please send to [facility] on file.

## 2019-11-04 ENCOUNTER — Other Ambulatory Visit: Payer: Self-pay

## 2019-11-04 ENCOUNTER — Telehealth (INDEPENDENT_AMBULATORY_CARE_PROVIDER_SITE_OTHER): Payer: BC Managed Care – PPO | Admitting: Family Medicine

## 2019-11-04 ENCOUNTER — Encounter: Payer: Self-pay | Admitting: Family Medicine

## 2019-11-04 DIAGNOSIS — R5383 Other fatigue: Secondary | ICD-10-CM

## 2019-11-04 DIAGNOSIS — L659 Nonscarring hair loss, unspecified: Secondary | ICD-10-CM

## 2019-11-04 NOTE — Progress Notes (Signed)
I have discussed the procedure for the virtual visit with the patient who has given consent to proceed with assessment and treatment.   Pt unable to obtain vitals.   Chai Routh L Natia Fahmy, CMA     

## 2019-11-04 NOTE — Progress Notes (Signed)
Virtual Visit via Video   I connected with patient on 11/04/19 at 10:30 AM EDT by a video enabled telemedicine application and verified that I am speaking with the correct person using two identifiers.  Location patient: Home Location provider: [facility], Office Persons participating in the virtual visit: Patient, Provider, [place] (Jess B)  I discussed the limitations of evaluation and management by telemedicine and the availability of in person appointments. The patient expressed understanding and agreed to proceed.  Subjective:   HPI:   Fatigue- 'always tired, no matter how much sleep I get'.  Is suffering from hot flashes.  + hair loss.  Is on Levothyroxine 171mcg daily.  sxs started 'a good month ago or so'.  No menses since at least January (prior to that was on the pill)  She stopped the pill b/c she didn't think it was helpful but she's now realizing that maybe it was doing more than she thought.  Wants to make sure there is nothing else going on before attributing everything to menopause.  ROS:   See pertinent positives and negatives per HPI.  Patient Active Problem List   Diagnosis Date Noted  . Menopausal symptoms 10/21/2017  . Physical exam 06/08/2014  . Depression 10/07/2013  . Migraines 10/07/2013  . Nail abnormality 10/07/2013  . Palpitations 01/09/2011  . Pain in the chest 01/09/2011  . Hypothyroidism 06/06/2009  . HYPERCHOLESTEROLEMIA 06/06/2009    Social History   Tobacco Use  . Smoking status: Former Research scientist (life sciences)  . Smokeless tobacco: Never Used  Substance Use Topics  . Alcohol use: Yes    Comment: occasionally wine    Current Outpatient Medications:  .  BIOTIN PO, Take 1 tablet by mouth daily. , Disp: , Rfl:  .  CALCIUM PO, Take 1 tablet by mouth daily. , Disp: , Rfl:  .  cholecalciferol (VITAMIN D3) 25 MCG (1000 UT) tablet, Take 1,000 Units by mouth daily., Disp: , Rfl:  .  ibuprofen (ADVIL,MOTRIN) 200 MG tablet, Take 600 mg by mouth every 6  (six) hours as needed for moderate pain., Disp: , Rfl:  .  levothyroxine (SYNTHROID) 137 MCG tablet, Take 1 tablet (137 mcg total) by mouth daily before breakfast., Disp: 90 tablet, Rfl: 1 .  lisdexamfetamine (VYVANSE) 60 MG capsule, Take 1 capsule (60 mg total) by mouth every morning., Disp: 30 capsule, Rfl: 0 .  lisdexamfetamine (VYVANSE) 70 MG capsule, Take 1 capsule (70 mg total) by mouth daily., Disp: 30 capsule, Rfl: 0 .  loratadine (CLARITIN) 10 MG tablet, Take 10 mg by mouth daily., Disp: , Rfl:  .  norethindrone-ethinyl estradiol (MICROGESTIN,JUNEL,LOESTRIN) 1-20 MG-MCG tablet, , Disp: , Rfl: 3 .  rizatriptan (MAXALT) 10 MG tablet, TAKE 1 TABLET BY MOUTH ONCE DAILY AS NEEDED, Disp: 10 tablet, Rfl: 0 .  sertraline (ZOLOFT) 100 MG tablet, Take 2 tablets (200 mg total) by mouth daily., Disp: 180 tablet, Rfl: 1 .  topiramate (TOPAMAX) 50 MG tablet, Take 1 tablet (50 mg total) by mouth 2 (two) times daily., Disp: 180 tablet, Rfl: 1 .  traZODone (DESYREL) 50 MG tablet, Take 1 tablet (50 mg total) by mouth at bedtime., Disp: 90 tablet, Rfl: 0 .  UBRELVY 100 MG TABS, , Disp: , Rfl:  .  valACYclovir (VALTREX) 500 MG tablet, Take 500 mg by mouth 2 (two) times daily as needed (outbreak). , Disp: , Rfl:   No Known Allergies  Objective:   There were no vitals taken for this visit. AAOx3, NAD NCAT, EOMI  No obvious CN deficits Coloring WNL Pt is able to speak clearly, coherently without shortness of breath or increased work of breathing.  Thought process is linear.  Mood is appropriate.   Assessment and Plan:   Fatigue- ongoing issue for pt.  Aware that she is sleeping poorly due to hot flashes but wants to r/o other possible causes.  Will check labs and address any abnormalities if present.  If normal, she will f/u w/ GYN.  Pt expressed understanding and is in agreement w/ plan.   Hair loss- may be due to abnormal thyroid levels or menopause.  Could also be related to iron levels.  Will check  labs to determine if there is a metabolic cause.   Neena Rhymes, MD 11/04/2019

## 2019-11-08 ENCOUNTER — Ambulatory Visit (INDEPENDENT_AMBULATORY_CARE_PROVIDER_SITE_OTHER): Payer: BC Managed Care – PPO

## 2019-11-08 ENCOUNTER — Other Ambulatory Visit: Payer: Self-pay

## 2019-11-08 DIAGNOSIS — R5383 Other fatigue: Secondary | ICD-10-CM

## 2019-11-08 DIAGNOSIS — L659 Nonscarring hair loss, unspecified: Secondary | ICD-10-CM

## 2019-11-08 LAB — BASIC METABOLIC PANEL
BUN: 17 mg/dL (ref 6–23)
CO2: 28 mEq/L (ref 19–32)
Calcium: 9.8 mg/dL (ref 8.4–10.5)
Chloride: 102 mEq/L (ref 96–112)
Creatinine, Ser: 0.88 mg/dL (ref 0.40–1.20)
GFR: 69.07 mL/min (ref >60.00)
Glucose, Bld: 83 mg/dL (ref 70–99)
Potassium: 3.7 mEq/L (ref 3.5–5.1)
Sodium: 140 mEq/L (ref 135–145)

## 2019-11-08 LAB — CBC WITH DIFFERENTIAL/PLATELET
Basophils Absolute: 0 10*3/uL (ref 0.0–0.1)
Basophils Relative: 1 % (ref 0.0–3.0)
Eosinophils Absolute: 0.1 10*3/uL (ref 0.0–0.7)
Eosinophils Relative: 3.7 % (ref 0.0–5.0)
HCT: 43.2 % (ref 36.0–46.0)
Hemoglobin: 14.7 g/dL (ref 12.0–15.0)
Lymphocytes Relative: 35.5 % (ref 12.0–46.0)
Lymphs Abs: 1.4 10*3/uL (ref 0.7–4.0)
MCHC: 34 g/dL (ref 30.0–36.0)
MCV: 90.8 fl (ref 78.0–100.0)
Monocytes Absolute: 0.3 10*3/uL (ref 0.1–1.0)
Monocytes Relative: 7.6 % (ref 3.0–12.0)
Neutro Abs: 2 10*3/uL (ref 1.4–7.7)
Neutrophils Relative %: 52.2 % (ref 43.0–77.0)
Platelets: 218 10*3/uL (ref 150.0–400.0)
RBC: 4.75 Mil/uL (ref 3.87–5.11)
RDW: 12.8 % (ref 11.5–15.5)
WBC: 3.9 10*3/uL — ABNORMAL LOW (ref 4.0–10.5)

## 2019-11-08 LAB — VITAMIN D 25 HYDROXY (VIT D DEFICIENCY, FRACTURES): VITD: 78.69 ng/mL (ref 30.00–100.00)

## 2019-11-08 LAB — HEPATIC FUNCTION PANEL
ALT: 11 U/L (ref 0–35)
AST: 18 U/L (ref 0–37)
Albumin: 4.5 g/dL (ref 3.5–5.2)
Alkaline Phosphatase: 48 U/L (ref 39–117)
Bilirubin, Direct: 0.1 mg/dL (ref 0.0–0.3)
Total Bilirubin: 0.6 mg/dL (ref 0.2–1.2)
Total Protein: 6.6 g/dL (ref 6.0–8.3)

## 2019-11-08 LAB — TSH: TSH: 0.14 u[IU]/mL — ABNORMAL LOW (ref 0.35–4.50)

## 2019-11-08 LAB — B12 AND FOLATE PANEL
Folate: 16.1 ng/mL (ref >5.9)
Vitamin B-12: 872 pg/mL (ref 211–911)

## 2019-11-09 DIAGNOSIS — G43009 Migraine without aura, not intractable, without status migrainosus: Secondary | ICD-10-CM | POA: Diagnosis not present

## 2019-11-09 LAB — IRON,TIBC AND FERRITIN PANEL
%SAT: 38 % (calc) (ref 16–45)
Ferritin: 44 ng/mL (ref 16–232)
Iron: 148 ug/dL (ref 40–190)
TIBC: 389 mcg/dL (calc) (ref 250–450)

## 2019-11-10 ENCOUNTER — Other Ambulatory Visit: Payer: Self-pay | Admitting: General Practice

## 2019-11-10 DIAGNOSIS — E039 Hypothyroidism, unspecified: Secondary | ICD-10-CM

## 2019-11-10 MED ORDER — LEVOTHYROXINE SODIUM 125 MCG PO TABS
125.0000 ug | ORAL_TABLET | Freq: Every day | ORAL | 3 refills | Status: DC
Start: 2019-11-10 — End: 2019-12-17

## 2019-11-10 NOTE — Progress Notes (Signed)
Called pt and lmovm to return call.  Chart updated to reflect new thyroid dose (sent to pharmacy) labs also ordered.

## 2019-11-10 NOTE — Progress Notes (Signed)
Called pt and lmovm to return call.

## 2019-11-11 ENCOUNTER — Other Ambulatory Visit: Payer: Self-pay

## 2019-11-11 DIAGNOSIS — E039 Hypothyroidism, unspecified: Secondary | ICD-10-CM

## 2019-12-17 ENCOUNTER — Ambulatory Visit (INDEPENDENT_AMBULATORY_CARE_PROVIDER_SITE_OTHER): Payer: BC Managed Care – PPO

## 2019-12-17 ENCOUNTER — Other Ambulatory Visit: Payer: Self-pay | Admitting: General Practice

## 2019-12-17 ENCOUNTER — Other Ambulatory Visit: Payer: Self-pay

## 2019-12-17 DIAGNOSIS — E039 Hypothyroidism, unspecified: Secondary | ICD-10-CM | POA: Diagnosis not present

## 2019-12-17 LAB — TSH: TSH: 0.34 u[IU]/mL — ABNORMAL LOW (ref 0.35–4.50)

## 2019-12-17 MED ORDER — LEVOTHYROXINE SODIUM 112 MCG PO TABS
112.0000 ug | ORAL_TABLET | Freq: Every day | ORAL | 3 refills | Status: DC
Start: 2019-12-17 — End: 2020-01-31

## 2020-01-19 ENCOUNTER — Encounter: Payer: Self-pay | Admitting: Psychiatry

## 2020-01-19 ENCOUNTER — Other Ambulatory Visit: Payer: Self-pay

## 2020-01-19 ENCOUNTER — Ambulatory Visit (INDEPENDENT_AMBULATORY_CARE_PROVIDER_SITE_OTHER): Payer: BC Managed Care – PPO | Admitting: Psychiatry

## 2020-01-19 DIAGNOSIS — F5105 Insomnia due to other mental disorder: Secondary | ICD-10-CM

## 2020-01-19 DIAGNOSIS — F411 Generalized anxiety disorder: Secondary | ICD-10-CM

## 2020-01-19 DIAGNOSIS — F5081 Binge eating disorder: Secondary | ICD-10-CM | POA: Diagnosis not present

## 2020-01-19 MED ORDER — SERTRALINE HCL 100 MG PO TABS: 200.0000 mg | ORAL_TABLET | Freq: Every day | ORAL | 1 refills | Status: DC

## 2020-01-19 MED ORDER — LISDEXAMFETAMINE DIMESYLATE 60 MG PO CAPS: 60.0000 mg | ORAL_CAPSULE | ORAL | 0 refills | Status: DC

## 2020-01-19 NOTE — Progress Notes (Signed)
Katherine [name] [id number] 1976/02/04 44 y.o.  Subjective:   [name] is a 44 y.o. (DOB 1976-01-07) female.  Chief Complaint:  Chief Complaint  Patient presents with  . Follow-up    Anxiety Patient reports no confusion, decreased concentration, nervous/anxious behavior or suicidal ideas.    Depression        Associated symptoms include no decreased concentration, no headaches and no suicidal ideas.  Past medical history includes anxiety.    [name] presents to the office today for follow-up of  EDO and mood. And stress.  Last seen in January 2021.  No meds were changed.  01/19/2020 appointment with the following noted: Mood has been OK.  Dog with cancer. Normal sadness. Tolerating meds except lower dose Topomax DT word-finding problems.  less struggles with some depression chronically.  Managing OK.  Can't go to gym which helped her mood.  Handling better than she did.   Sleep better now without meds.  No concerns about the meds.  Vyvanse helps but wonders if she' more tolerant to it.  Can't take more topomax without cog problems.  Still binge eats.  Generally not depressed.  Function is okay.  Still gets anxious and stressed but has triggers for that.  Tolerates meds.  No change in binge eating pattern.  Sleep affected by stress and don't want sleep meds.  No new meds.  Past Psychiatric Medication Trials: NAC capsules heartburn,    Review of Systems:  Review of Systems  Gastrointestinal: Negative for constipation.  Neurological: Negative for tremors, weakness and headaches.  Psychiatric/Behavioral: Positive for depression. Negative for agitation, behavioral problems, confusion, decreased concentration, dysphoric mood, hallucinations, self-injury, sleep disturbance and suicidal ideas. The patient is not nervous/anxious and is not hyperactive.     Medications: I have reviewed the patient's current medications.  Current Outpatient Medications  Medication  Sig Dispense Refill  . BIOTIN PO Take 1 tablet by mouth daily.     Marland Kitchen CALCIUM PO Take 1 tablet by mouth daily.     . cholecalciferol (VITAMIN D3) 25 MCG (1000 UT) tablet Take 1,000 Units by mouth daily.    Marland Kitchen ibuprofen (ADVIL,MOTRIN) 200 MG tablet Take 600 mg by mouth every 6 (six) hours as needed for moderate pain.    Marland Kitchen levothyroxine (SYNTHROID) 112 MCG tablet Take 1 tablet (112 mcg total) by mouth daily. 30 tablet 3  . loratadine (CLARITIN) 10 MG tablet Take 10 mg by mouth daily.    . norethindrone-ethinyl estradiol (MICROGESTIN,JUNEL,LOESTRIN) 1-20 MG-MCG tablet   3  . rizatriptan (MAXALT) 10 MG tablet TAKE 1 TABLET BY MOUTH ONCE DAILY AS NEEDED 10 tablet 0  . topiramate (TOPAMAX) 50 MG tablet Take 1 tablet (50 mg total) by mouth 2 (two) times daily. (Patient taking differently: Take 50 mg by mouth at bedtime. ) 180 tablet 1  . UBRELVY 100 MG TABS     . valACYclovir (VALTREX) 500 MG tablet Take 500 mg by mouth 2 (two) times daily as needed (outbreak).     Marland Kitchen lisdexamfetamine (VYVANSE) 60 MG capsule Take 1 capsule (60 mg total) by mouth every morning. 30 capsule 0  . [START ON 02/16/2020] lisdexamfetamine (VYVANSE) 60 MG capsule Take 1 capsule (60 mg total) by mouth every morning. 30 capsule 0  . [START ON 03/15/2020] lisdexamfetamine (VYVANSE) 60 MG capsule Take 1 capsule (60 mg total) by mouth every morning. 30 capsule 0  . lisdexamfetamine (VYVANSE) 70 MG capsule Take 1 capsule (70 mg total)  by mouth daily. (Patient not taking: Reported on 01/19/2020) 30 capsule 0  . sertraline (ZOLOFT) 100 MG tablet Take 2 tablets (200 mg total) by mouth daily. 180 tablet 1  . traZODone (DESYREL) 50 MG tablet Take 1 tablet (50 mg total) by mouth at bedtime. (Patient not taking: Reported on 01/19/2020) 90 tablet 0   No current facility-administered medications for this visit.    Medication Side Effects: None  Allergies: No Known Allergies  Past Medical History:  Diagnosis Date  . Migraine   . Unspecified  hypothyroidism     Family History  Problem Relation Age of Onset  . Alcohol abuse Mother   . Cancer Mother        lung  . Heart disease Mother   . Hypertension Mother   . Hyperlipidemia Father   . Heart disease Father   . Hypertension Father   . Diabetes Father   . Cancer Other   . Hypertension Other   . Hyperlipidemia Other   . Heart attack Other   . Stroke Other     Social History   Socioeconomic History  . Marital status: Married    Spouse name: Not on file  . Number of children: 1  . Years of education: Not on file  . Highest education level: Not on file  Occupational History    Employer: UNEMPLOYED  Tobacco Use  . Smoking status: Former Games developer  . Smokeless tobacco: Never Used  Vaping Use  . Vaping Use: Never used  Substance and Sexual Activity  . Alcohol use: Yes    Comment: occasionally wine  . Drug use: No  . Sexual activity: Not on file  Other Topics Concern  . Not on file  Social History Narrative  . Not on file   Social Determinants of Health   Financial Resource Strain:   . Difficulty of Paying Living Expenses:   Food Insecurity:   . Worried About Programme researcher, broadcasting/film/video in the Last Year:   . Barista in the Last Year:   Transportation Needs:   . Freight forwarder (Medical):   Marland Kitchen Lack of Transportation (Non-Medical):   Physical Activity:   . Days of Exercise per Week:   . Minutes of Exercise per Session:   Stress:   . Feeling of Stress :   Social Connections:   . Frequency of Communication with Friends and Family:   . Frequency of Social Gatherings with Friends and Family:   . Attends Religious Services:   . Active Member of Clubs or Organizations:   . Attends Banker Meetings:   Marland Kitchen Marital Status:   Intimate Partner Violence:   . Fear of Current or Ex-Partner:   . Emotionally Abused:   Marland Kitchen Physically Abused:   . Sexually Abused:     Past Medical History, Surgical history, Social history, and Family history were  reviewed and updated as appropriate.   Please see review of systems for further details on the patient's review from today.   Objective:   Physical Exam:  There were no vitals taken for this visit.  Physical Exam Constitutional:      General: She is not in acute distress.    Appearance: She is well-developed.  Musculoskeletal:        General: No deformity.  Neurological:     Mental Status: She is alert and oriented to person, place, and time.     Motor: No tremor.     Coordination: Coordination normal.  Gait: Gait normal.  Psychiatric:        Attention and Perception: Attention and perception normal.        Mood and Affect: Mood is anxious. Mood is not depressed. Affect is not labile, blunt, angry or inappropriate.        Speech: Speech normal.        Behavior: Behavior normal.        Thought Content: Thought content normal. Thought content does not include homicidal or suicidal ideation. Thought content does not include homicidal or suicidal plan.        Cognition and Memory: Cognition normal.        Judgment: Judgment normal.     Comments: Insight intact. No auditory or visual hallucinations. No delusions.      Lab Review:     Component Value Date/Time   NA 140 11/08/2019 0829   K 3.7 11/08/2019 0829   CL 102 11/08/2019 0829   CO2 28 11/08/2019 0829   GLUCOSE 83 11/08/2019 0829   BUN 17 11/08/2019 0829   CREATININE 0.88 11/08/2019 0829   CALCIUM 9.8 11/08/2019 0829   PROT 6.6 11/08/2019 0829   ALBUMIN 4.5 11/08/2019 0829   AST 18 11/08/2019 0829   ALT 11 11/08/2019 0829   ALKPHOS 48 11/08/2019 0829   BILITOT 0.6 11/08/2019 0829   GFRNONAA >60 01/10/2015 1910   GFRAA >60 01/10/2015 1910       Component Value Date/Time   WBC 3.9 (L) 11/08/2019 0829   RBC 4.75 11/08/2019 0829   HGB 14.7 11/08/2019 0829   HCT 43.2 11/08/2019 0829   PLT 218.0 11/08/2019 0829   MCV 90.8 11/08/2019 0829   MCH 30.8 01/10/2015 1910   MCHC 34.0 11/08/2019 0829   RDW 12.8  11/08/2019 0829   LYMPHSABS 1.4 11/08/2019 0829   MONOABS 0.3 11/08/2019 0829   EOSABS 0.1 11/08/2019 0829   BASOSABS 0.0 11/08/2019 0829    No results found for: POCLITH, LITHIUM   No results found for: PHENYTOIN, PHENOBARB, VALPROATE, CBMZ   .res Assessment: Plan:    Barth Kirkseri was seen today for follow-up.  Diagnoses and all orders for this visit:  Generalized anxiety disorder -     sertraline (ZOLOFT) 100 MG tablet; Take 2 tablets (200 mg total) by mouth daily.  Binge eating disorder -     sertraline (ZOLOFT) 100 MG tablet; Take 2 tablets (200 mg total) by mouth daily. -     lisdexamfetamine (VYVANSE) 60 MG capsule; Take 1 capsule (60 mg total) by mouth every morning. -     lisdexamfetamine (VYVANSE) 60 MG capsule; Take 1 capsule (60 mg total) by mouth every morning. -     lisdexamfetamine (VYVANSE) 60 MG capsule; Take 1 capsule (60 mg total) by mouth every morning.  Insomnia due to mental condition    Supportive therapy dealing with son and COVID and being isolated.  Unlikely other meds for EDO would help.  The 2 best are Vyvanse and topiramate.  No indication for med change.  Vyvanse does help her with the binge eating but it does not solve the problem.  She has had counseling.  She alternates between 60 and 70 mg.  At times to 70 mg seems to be a little too high making her feel jittery.  The other reason she alternates is to reduce the risk of tolerance.   Anxiety is generally manageable with the sertraline it clearly helps.  Couldn't tolerate NAC DT heartburn.  May try again with tablets.  This will be off label for the binge eating compulsiveness.  Disc insomnia as risk factor for mood poor impulse control with binge eating, cognitive risks. Trazodone 50-100 trial for sleep.  Disc SE  Disc off label potential of Ozempic for binge eating may become a possibility in the future.  Disc potential off label   FU 6 mos   Meredith Staggers, MD, DFAPA  Please see After Visit  Summary for patient specific instructions.  No future appointments.  No orders of the defined types were placed in this encounter.     -------------------------------

## 2020-01-25 ENCOUNTER — Ambulatory Visit: Payer: BC Managed Care – PPO

## 2020-01-26 ENCOUNTER — Ambulatory Visit: Payer: BC Managed Care – PPO

## 2020-01-27 DIAGNOSIS — L249 Irritant contact dermatitis, unspecified cause: Secondary | ICD-10-CM | POA: Diagnosis not present

## 2020-01-27 DIAGNOSIS — B0089 Other herpesviral infection: Secondary | ICD-10-CM | POA: Diagnosis not present

## 2020-01-27 DIAGNOSIS — L603 Nail dystrophy: Secondary | ICD-10-CM | POA: Diagnosis not present

## 2020-01-28 ENCOUNTER — Ambulatory Visit (INDEPENDENT_AMBULATORY_CARE_PROVIDER_SITE_OTHER): Payer: BC Managed Care – PPO

## 2020-01-28 ENCOUNTER — Telehealth: Payer: Self-pay

## 2020-01-28 ENCOUNTER — Other Ambulatory Visit: Payer: Self-pay

## 2020-01-28 DIAGNOSIS — E039 Hypothyroidism, unspecified: Secondary | ICD-10-CM | POA: Diagnosis not present

## 2020-01-28 LAB — TSH: TSH: 13.64 u[IU]/mL — ABNORMAL HIGH (ref 0.35–4.50)

## 2020-01-28 NOTE — Telephone Encounter (Signed)
Patient was in office for lab appt and was wondering if her taking biotin and vitamin B could be affecting her TSH levels. Please advise.

## 2020-01-31 ENCOUNTER — Other Ambulatory Visit: Payer: Self-pay | Admitting: General Practice

## 2020-01-31 DIAGNOSIS — E039 Hypothyroidism, unspecified: Secondary | ICD-10-CM

## 2020-01-31 MED ORDER — LEVOTHYROXINE SODIUM 125 MCG PO TABS
125.0000 ug | ORAL_TABLET | Freq: Every day | ORAL | 3 refills | Status: DC
Start: 2020-01-31 — End: 2020-12-06

## 2020-01-31 NOTE — Telephone Encounter (Signed)
Patient notified of PCP recommendations and is agreement and expresses an understanding.  

## 2020-01-31 NOTE — Telephone Encounter (Signed)
B Vitamins would not interfere w/ the thyroid numbers but biotin can cause falsely low TSH (looking like hyperthyroidism).  Upon further research, it is recommended to stop biotin supplements 2 days before labs to avoid erroneous levels.

## 2020-03-03 ENCOUNTER — Ambulatory Visit (INDEPENDENT_AMBULATORY_CARE_PROVIDER_SITE_OTHER): Payer: Self-pay

## 2020-03-03 ENCOUNTER — Other Ambulatory Visit: Payer: Self-pay

## 2020-03-03 DIAGNOSIS — E039 Hypothyroidism, unspecified: Secondary | ICD-10-CM

## 2020-03-03 NOTE — Addendum Note (Signed)
Addended by: Tharon Aquas on: 03/03/2020 08:37 AM   Modules accepted: Orders

## 2020-03-04 LAB — TSH: TSH: 4.46 mIU/L

## 2020-03-06 ENCOUNTER — Encounter: Payer: Self-pay | Admitting: General Practice

## 2020-04-10 ENCOUNTER — Telehealth: Payer: Self-pay | Admitting: Psychiatry

## 2020-04-10 NOTE — Telephone Encounter (Signed)
She should have 1 more Rx on file to fill.  Grover Canavan can you check with pharmacy to confirm they have

## 2020-04-10 NOTE — Telephone Encounter (Signed)
Pt called and said that she needs a refill on her vyvanse 60 mg to be sent to [facility] on [address]

## 2020-04-13 NOTE — Telephone Encounter (Signed)
Pharmacy does have one on file and they will fill it for her.

## 2020-04-13 NOTE — Telephone Encounter (Signed)
Noted thank you

## 2020-05-22 ENCOUNTER — Other Ambulatory Visit: Payer: Self-pay | Admitting: Psychiatry

## 2020-05-22 ENCOUNTER — Telehealth: Payer: Self-pay | Admitting: Psychiatry

## 2020-05-22 DIAGNOSIS — F5081 Binge eating disorder: Secondary | ICD-10-CM

## 2020-05-22 MED ORDER — LISDEXAMFETAMINE DIMESYLATE 60 MG PO CAPS: 60.0000 mg | ORAL_CAPSULE | ORAL | 0 refills | Status: DC

## 2020-05-22 NOTE — Telephone Encounter (Signed)
Katiya called to request refill of her Vyvanse 60 mg.  appt 07/17/20.  Please send to [facility]

## 2020-06-05 ENCOUNTER — Telehealth (INDEPENDENT_AMBULATORY_CARE_PROVIDER_SITE_OTHER): Payer: 59 | Admitting: Physician Assistant

## 2020-06-05 ENCOUNTER — Encounter: Payer: Self-pay | Admitting: Physician Assistant

## 2020-06-05 ENCOUNTER — Other Ambulatory Visit: Payer: Self-pay

## 2020-06-05 DIAGNOSIS — U071 COVID-19: Secondary | ICD-10-CM | POA: Diagnosis not present

## 2020-06-05 NOTE — Progress Notes (Signed)
Virtual Visit via Video   I connected with patient on 06/05/20 at  4:00 PM EST by a video enabled telemedicine application and verified that I am speaking with the correct person using two identifiers.  Location patient: Home Location provider: [place] [facility], Office Persons participating in the virtual visit: Patient, Provider, CMA (Patina Moore)  I discussed the limitations of evaluation and management by telemedicine and the availability of in person appointments. The patient expressed understanding and agreed to proceed.  Subjective:   HPI:   Patient presents via [facility] today c/o 3 days of nasal congestion, cough, fatigue and loss of taste and smell.  Denies fever, chills or aches.  Denies chest pain or shortness of breath.  Denies any major chest congestion.  Some scratchy throat.  Has been recently hospitalized with Covid.  Is now home and doing well.  Patient had Covid test earlier this morning which was positive.  Patient denies any cardiopulmonary history.  ROS:   See pertinent positives and negatives per HPI.  Patient Active Problem List   Diagnosis Date Noted  . Menopausal symptoms 10/21/2017  . Physical exam 06/08/2014  . Depression 10/07/2013  . Migraines 10/07/2013  . Nail abnormality 10/07/2013  . Palpitations 01/09/2011  . Pain in the chest 01/09/2011  . Hypothyroidism 06/06/2009  . HYPERCHOLESTEROLEMIA 06/06/2009    Social History   Tobacco Use  . Smoking status: Former Games developer  . Smokeless tobacco: Never Used  Substance Use Topics  . Alcohol use: Yes    Comment: occasionally wine    Current Outpatient Medications:  .  BIOTIN PO, Take 1 tablet by mouth daily. , Disp: , Rfl:  .  CALCIUM PO, Take 1 tablet by mouth daily., Disp: , Rfl:  .  cholecalciferol (VITAMIN D3) 25 MCG (1000 UT) tablet, Take 1,000 Units by mouth daily., Disp: , Rfl:  .  ibuprofen (ADVIL,MOTRIN) 200 MG tablet, Take 600 mg by mouth every 6 (six) hours as needed for moderate  pain., Disp: , Rfl:  .  levothyroxine (SYNTHROID) 125 MCG tablet, Take 1 tablet (125 mcg total) by mouth daily., Disp: 30 tablet, Rfl: 3 .  [START ON 07/17/2020] lisdexamfetamine (VYVANSE) 60 MG capsule, Take 1 capsule (60 mg total) by mouth every morning., Disp: 30 capsule, Rfl: 0 .  [START ON 06/19/2020] lisdexamfetamine (VYVANSE) 60 MG capsule, Take 1 capsule (60 mg total) by mouth every morning., Disp: 30 capsule, Rfl: 0 .  lisdexamfetamine (VYVANSE) 60 MG capsule, Take 1 capsule (60 mg total) by mouth every morning., Disp: 30 capsule, Rfl: 0 .  lisdexamfetamine (VYVANSE) 70 MG capsule, Take 1 capsule (70 mg total) by mouth daily., Disp: 30 capsule, Rfl: 0 .  loratadine (CLARITIN) 10 MG tablet, Take 10 mg by mouth daily., Disp: , Rfl:  .  rizatriptan (MAXALT) 10 MG tablet, TAKE 1 TABLET BY MOUTH ONCE DAILY AS NEEDED, Disp: 10 tablet, Rfl: 0 .  sertraline (ZOLOFT) 100 MG tablet, Take 2 tablets (200 mg total) by mouth daily., Disp: 180 tablet, Rfl: 1 .  topiramate (TOPAMAX) 50 MG tablet, Take 1 tablet (50 mg total) by mouth 2 (two) times daily. (Patient taking differently: Take 50 mg by mouth at bedtime.), Disp: 180 tablet, Rfl: 1 .  UBRELVY 100 MG TABS, , Disp: , Rfl:  .  valACYclovir (VALTREX) 500 MG tablet, Take 500 mg by mouth 2 (two) times daily as needed (outbreak). , Disp: , Rfl:   No Known Allergies  Objective:   There were no vitals taken  for this visit.  Patient is well-developed, well-nourished in no acute distress.  Resting comfortably at home.  Head is normocephalic, atraumatic.  No labored breathing.  Speech is clear and coherent with logical content.  Patient is alert and oriented at baseline.   Assessment and Plan:   1. COVID-19 Positive test.  Classic but mild symptoms.  Not a candidate for infusion per current guidelines.  Start vitamin D 1000 units daily, vitamin C 1000 mg daily and an OTC zinc supplement.  Keep hydrated.  Tylenol if needed for headache or aches.  OTC  Mucinex and Tylenol Cold and sinus discussed.  Patient enrolled in Covid MyChart monitoring program.  Strict ER precautions discussed with patient.  Instructions sent to patient's my chart for viewing.  Patient voiced understanding and agreement with plan.    Piedad Climes, PA-C 06/05/2020

## 2020-06-05 NOTE — Patient Instructions (Signed)
Instructions sent to patient's MyChart.

## 2020-06-05 NOTE — Progress Notes (Signed)
I have discussed the procedure for the virtual visit with the patient who has given consent to proceed with assessment and treatment.   Myrle Wanek S Shyloh Krinke, CMA     

## 2020-07-17 ENCOUNTER — Encounter: Payer: Self-pay | Admitting: Psychiatry

## 2020-07-17 ENCOUNTER — Ambulatory Visit (INDEPENDENT_AMBULATORY_CARE_PROVIDER_SITE_OTHER): Payer: Self-pay | Admitting: Psychiatry

## 2020-07-17 ENCOUNTER — Other Ambulatory Visit: Payer: Self-pay

## 2020-07-17 DIAGNOSIS — F5081 Binge eating disorder: Secondary | ICD-10-CM

## 2020-07-17 DIAGNOSIS — F5105 Insomnia due to other mental disorder: Secondary | ICD-10-CM

## 2020-07-17 DIAGNOSIS — F411 Generalized anxiety disorder: Secondary | ICD-10-CM

## 2020-07-17 MED ORDER — TOPIRAMATE 50 MG PO TABS: 50.0000 mg | ORAL_TABLET | Freq: Two times a day (BID) | ORAL | 1 refills | Status: DC

## 2020-07-17 MED ORDER — SERTRALINE HCL 100 MG PO TABS: 200.0000 mg | ORAL_TABLET | Freq: Every day | ORAL | 1 refills | Status: DC

## 2020-07-17 MED ORDER — LISDEXAMFETAMINE DIMESYLATE 60 MG PO CAPS: 60.0000 mg | ORAL_CAPSULE | ORAL | 0 refills | Status: DC

## 2020-07-17 MED ORDER — LISDEXAMFETAMINE DIMESYLATE 60 MG PO CAPS
60.0000 mg | ORAL_CAPSULE | ORAL | 0 refills | Status: DC
Start: 2020-09-11 — End: 2020-07-18

## 2020-07-17 NOTE — Patient Instructions (Signed)
clinicaltrials.gov

## 2020-07-17 NOTE — Progress Notes (Signed)
Katherine [name] [id number] 05-10-1976 45 y.o.  Subjective:   [name] is a 45 y.o. (DOB 1976/03/15) female.  Chief Complaint:  Chief Complaint  Patient presents with  . Follow-up  . Anxiety  . Eating Disorder    Anxiety Patient reports no confusion, decreased concentration, nervous/anxious behavior, palpitations or suicidal ideas.    Depression        Associated symptoms include no decreased concentration, no headaches and no suicidal ideas.  Past medical history includes anxiety.    [name] presents to the office today for follow-up of  EDO and mood. And stress.  seen in January 2021.  No meds were changed.  01/19/2020 appointment with the following noted: Mood has been OK.  Dog with cancer. Normal sadness. Tolerating meds except lower dose Topomax DT word-finding problems.  less struggles with some depression chronically.  Managing OK.  Can't go to gym which helped her mood.  Handling better than she did.   Sleep better now without meds. No concerns about the meds.  Vyvanse helps but wonders if she' more tolerant to it.  Can't take more topomax without cog problems.  Still binge eats.  Generally not depressed.  Function is okay.  Still gets anxious and stressed but has triggers for that.  Tolerates meds.  No change in binge eating pattern. Sleep affected by stress and don't want sleep meds. No new meds.  07/17/2020 appointment with following noted: Covid December through holidays.  Ed hospitalized briefly and recovered. Doing OK.  Louis at [employer] and going OK so far. Most of the time feels good.  Big transition having Louis come home and everyone sick. EDO not the best with holidays and stress and change in routine.  Would be great if there was something that could make it go away.  Past Psychiatric Medication Trials: NAC capsules heartburn,   Review of Systems:  Review of Systems  Cardiovascular: Negative for palpitations.  Gastrointestinal:  Negative for constipation.  Neurological: Negative for tremors, weakness and headaches.  Psychiatric/Behavioral: Positive for depression. Negative for agitation, behavioral problems, confusion, decreased concentration, dysphoric mood, hallucinations, self-injury, sleep disturbance and suicidal ideas. The patient is not nervous/anxious and is not hyperactive.     Medications: I have reviewed the patient's current medications.  Current Outpatient Medications  Medication Sig Dispense Refill  . BIOTIN PO Take 1 tablet by mouth daily.     Marland Kitchen CALCIUM PO Take 1 tablet by mouth daily.    . cholecalciferol (VITAMIN D3) 25 MCG (1000 UT) tablet Take 1,000 Units by mouth daily.    Marland Kitchen ibuprofen (ADVIL,MOTRIN) 200 MG tablet Take 600 mg by mouth every 6 (six) hours as needed for moderate pain.    Marland Kitchen levothyroxine (SYNTHROID) 125 MCG tablet Take 1 tablet (125 mcg total) by mouth daily. 30 tablet 3  . loratadine (CLARITIN) 10 MG tablet Take 10 mg by mouth daily.    . rizatriptan (MAXALT) 10 MG tablet TAKE 1 TABLET BY MOUTH ONCE DAILY AS NEEDED 10 tablet 0  . UBRELVY 100 MG TABS     . valACYclovir (VALTREX) 500 MG tablet Take 500 mg by mouth 2 (two) times daily as needed (outbreak).     Marland Kitchen lisdexamfetamine (VYVANSE) 60 MG capsule Take 1 capsule (60 mg total) by mouth every morning. 30 capsule 0  . [START ON 08/14/2020] lisdexamfetamine (VYVANSE) 60 MG capsule Take 1 capsule (60 mg total) by mouth every morning. 30 capsule 0  . [  START ON 09/11/2020] lisdexamfetamine (VYVANSE) 60 MG capsule Take 1 capsule (60 mg total) by mouth every morning. 30 capsule 0  . sertraline (ZOLOFT) 100 MG tablet Take 2 tablets (200 mg total) by mouth daily. 180 tablet 1  . topiramate (TOPAMAX) 50 MG tablet Take 1 tablet (50 mg total) by mouth 2 (two) times daily. 180 tablet 1   No current facility-administered medications for this visit.    Medication Side Effects: None  Allergies: No Known Allergies  Past Medical History:   Diagnosis Date  . Migraine   . Unspecified hypothyroidism     Family History  Problem Relation Age of Onset  . Alcohol abuse Mother   . Cancer Mother        lung  . Heart disease Mother   . Hypertension Mother   . Hyperlipidemia Father   . Heart disease Father   . Hypertension Father   . Diabetes Father   . Cancer Other   . Hypertension Other   . Hyperlipidemia Other   . Heart attack Other   . Stroke Other     Social History   Socioeconomic History  . Marital status: Married    Spouse name: Not on file  . Number of children: 1  . Years of education: Not on file  . Highest education level: Not on file  Occupational History    Employer: UNEMPLOYED  Tobacco Use  . Smoking status: Former Games developer  . Smokeless tobacco: Never Used  Vaping Use  . Vaping Use: Never used  Substance and Sexual Activity  . Alcohol use: Yes    Comment: occasionally wine  . Drug use: No  . Sexual activity: Not on file  Other Topics Concern  . Not on file  Social History Narrative  . Not on file   Social Determinants of Health   Financial Resource Strain: Not on file  Food Insecurity: Not on file  Transportation Needs: Not on file  Physical Activity: Not on file  Stress: Not on file  Social Connections: Not on file  Intimate Partner Violence: Not on file    Past Medical History, Surgical history, Social history, and Family history were reviewed and updated as appropriate.   Please see review of systems for further details on the patient's review from today.   Objective:   Physical Exam:  There were no vitals taken for this visit.  Physical Exam Constitutional:      General: She is not in acute distress.    Appearance: She is well-developed.  Musculoskeletal:        General: No deformity.  Neurological:     Mental Status: She is alert and oriented to person, place, and time.     Motor: No tremor.     Coordination: Coordination normal.     Gait: Gait normal.   Psychiatric:        Attention and Perception: Attention and perception normal.        Mood and Affect: Mood is anxious. Mood is not depressed. Affect is not labile, blunt, angry or inappropriate.        Speech: Speech normal.        Behavior: Behavior normal.        Thought Content: Thought content normal. Thought content does not include homicidal or suicidal ideation. Thought content does not include homicidal or suicidal plan.        Cognition and Memory: Cognition normal.        Judgment: Judgment normal.  Comments: Insight intact. No auditory or visual hallucinations. No delusions.      Lab Review:     Component Value Date/Time   NA 140 11/08/2019 0829   K 3.7 11/08/2019 0829   CL 102 11/08/2019 0829   CO2 28 11/08/2019 0829   GLUCOSE 83 11/08/2019 0829   BUN 17 11/08/2019 0829   CREATININE 0.88 11/08/2019 0829   CALCIUM 9.8 11/08/2019 0829   PROT 6.6 11/08/2019 0829   ALBUMIN 4.5 11/08/2019 0829   AST 18 11/08/2019 0829   ALT 11 11/08/2019 0829   ALKPHOS 48 11/08/2019 0829   BILITOT 0.6 11/08/2019 0829   GFRNONAA >60 01/10/2015 1910   GFRAA >60 01/10/2015 1910       Component Value Date/Time   WBC 3.9 (L) 11/08/2019 0829   RBC 4.75 11/08/2019 0829   HGB 14.7 11/08/2019 0829   HCT 43.2 11/08/2019 0829   PLT 218.0 11/08/2019 0829   MCV 90.8 11/08/2019 0829   MCH 30.8 01/10/2015 1910   MCHC 34.0 11/08/2019 0829   RDW 12.8 11/08/2019 0829   LYMPHSABS 1.4 11/08/2019 0829   MONOABS 0.3 11/08/2019 0829   EOSABS 0.1 11/08/2019 0829   BASOSABS 0.0 11/08/2019 0829    No results found for: POCLITH, LITHIUM   No results found for: PHENYTOIN, PHENOBARB, VALPROATE, CBMZ   .res Assessment: Plan:    Aneta was seen today for follow-up, anxiety and eating disorder.  Diagnoses and all orders for this visit:  Generalized anxiety disorder -     sertraline (ZOLOFT) 100 MG tablet; Take 2 tablets (200 mg total) by mouth daily. -     topiramate (TOPAMAX) 50 MG tablet;  Take 1 tablet (50 mg total) by mouth 2 (two) times daily.  Binge eating disorder -     sertraline (ZOLOFT) 100 MG tablet; Take 2 tablets (200 mg total) by mouth daily. -     topiramate (TOPAMAX) 50 MG tablet; Take 1 tablet (50 mg total) by mouth 2 (two) times daily. -     lisdexamfetamine (VYVANSE) 60 MG capsule; Take 1 capsule (60 mg total) by mouth every morning. -     lisdexamfetamine (VYVANSE) 60 MG capsule; Take 1 capsule (60 mg total) by mouth every morning. -     lisdexamfetamine (VYVANSE) 60 MG capsule; Take 1 capsule (60 mg total) by mouth every morning.  Insomnia due to mental condition   Supportive therapy dealing with son and COVID and being isolated.  Unlikely other meds for EDO would help.  The 2 best are Vyvanse and topiramate.  No indication for med change.  Vyvanse does help her with the binge eating but it does not solve the problem.  She has had counseling.  She alternates between 60 and 70 mg.  At times to 70 mg seems to be a little too high making her feel jittery.  The other reason she alternates is to reduce the risk of tolerance.   Anxiety is generally manageable with the sertraline it clearly helps.  Couldn't tolerate NAC DT heartburn.  May try again with tablets.  This will be off label for the binge eating compulsiveness.  Disc insomnia as risk factor for mood poor impulse control with binge eating, cognitive risks. Trazodone 50-100 trial for sleep.  Disc SE  Disc off label potential of Ozempic for binge eating may become a possibility in the future.  Rec look for trials at [facility], etc. theoretically, based on the way this medication prevents or impairs overeating it may  be an effective treatment for binge eating disorder.  Disc potential off label   FU 6 mos   Meredith Staggersarey Cottle, MD, DFAPA  Please see After Visit Summary for patient specific instructions.  No future appointments.  No orders of the defined types were placed in this encounter.      -------------------------------

## 2020-07-18 ENCOUNTER — Other Ambulatory Visit: Payer: Self-pay

## 2020-07-18 DIAGNOSIS — F5081 Binge eating disorder: Secondary | ICD-10-CM

## 2020-07-18 DIAGNOSIS — F411 Generalized anxiety disorder: Secondary | ICD-10-CM

## 2020-07-18 MED ORDER — TOPIRAMATE 50 MG PO TABS: 50.0000 mg | ORAL_TABLET | Freq: Two times a day (BID) | ORAL | 1 refills | Status: DC

## 2020-07-18 MED ORDER — LISDEXAMFETAMINE DIMESYLATE 60 MG PO CAPS
60.0000 mg | ORAL_CAPSULE | ORAL | 0 refills | Status: DC
Start: 2020-09-11 — End: 2021-01-02

## 2020-12-06 ENCOUNTER — Other Ambulatory Visit: Payer: Self-pay | Admitting: Family Medicine

## 2020-12-06 ENCOUNTER — Telehealth: Payer: Self-pay | Admitting: Psychiatry

## 2020-12-06 ENCOUNTER — Other Ambulatory Visit: Payer: Self-pay

## 2020-12-06 DIAGNOSIS — F5081 Binge eating disorder: Secondary | ICD-10-CM

## 2020-12-06 MED ORDER — LISDEXAMFETAMINE DIMESYLATE 60 MG PO CAPS: 60.0000 mg | ORAL_CAPSULE | ORAL | 0 refills | Status: DC

## 2020-12-06 NOTE — Telephone Encounter (Signed)
Patient lm requesting a refill on the Vyvanse. Fill at the [facility] [address] [place] - [address]  [address] [place] [address]  Phone:  [phone]  Fax:  [phone]

## 2020-12-06 NOTE — Telephone Encounter (Signed)
pended

## 2020-12-20 ENCOUNTER — Encounter: Payer: Self-pay | Admitting: *Deleted

## 2021-01-02 ENCOUNTER — Encounter: Payer: Self-pay | Admitting: Psychiatry

## 2021-01-02 ENCOUNTER — Other Ambulatory Visit: Payer: Self-pay | Admitting: Family Medicine

## 2021-01-02 ENCOUNTER — Ambulatory Visit (INDEPENDENT_AMBULATORY_CARE_PROVIDER_SITE_OTHER): Payer: 59 | Admitting: Psychiatry

## 2021-01-02 ENCOUNTER — Other Ambulatory Visit: Payer: Self-pay

## 2021-01-02 VITALS — BP 135/96 | HR 66

## 2021-01-02 DIAGNOSIS — F5081 Binge eating disorder: Secondary | ICD-10-CM

## 2021-01-02 DIAGNOSIS — F411 Generalized anxiety disorder: Secondary | ICD-10-CM

## 2021-01-02 DIAGNOSIS — F5105 Insomnia due to other mental disorder: Secondary | ICD-10-CM | POA: Diagnosis not present

## 2021-01-02 MED ORDER — SERTRALINE HCL 100 MG PO TABS: 200.0000 mg | ORAL_TABLET | Freq: Every day | ORAL | 1 refills | Status: DC

## 2021-01-02 MED ORDER — NALTREXONE HCL 50 MG PO TABS: 50.0000 mg | ORAL_TABLET | Freq: Every day | ORAL | 2 refills | Status: DC

## 2021-01-02 MED ORDER — LISDEXAMFETAMINE DIMESYLATE 60 MG PO CAPS
60.0000 mg | ORAL_CAPSULE | ORAL | 0 refills | Status: DC
Start: 2021-01-30 — End: 2021-05-31

## 2021-01-02 MED ORDER — LISDEXAMFETAMINE DIMESYLATE 60 MG PO CAPS: 60.0000 mg | ORAL_CAPSULE | ORAL | 0 refills | Status: DC

## 2021-01-02 MED ORDER — LISDEXAMFETAMINE DIMESYLATE 60 MG PO CAPS
60.0000 mg | ORAL_CAPSULE | ORAL | 0 refills | Status: DC
Start: 2021-01-02 — End: 2021-06-29

## 2021-01-02 NOTE — Progress Notes (Signed)
Katherine [name] [id number] 03/22/1976 45 y.o.  Subjective:   [name] is a 45 y.o. (DOB 08/23/1975) female.  Chief Complaint:  Chief Complaint  Patient presents with   Follow-up   Anxiety   Eating Disorder    Anxiety Patient reports no confusion, decreased concentration, dizziness, nervous/anxious behavior, palpitations or suicidal ideas.    Depression        Associated symptoms include no decreased concentration, no headaches and no suicidal ideas.  Past medical history includes anxiety.   [name] presents to the office today for follow-up of  EDO and mood. And stress.  seen in January 2021.  No meds were changed.  01/19/2020 appointment with the following noted: Mood has been OK.  Dog with cancer. Normal sadness. Tolerating meds except lower dose Topomax DT word-finding problems.  less struggles with some depression chronically.  Managing OK.  Can't go to gym which helped her mood.  Handling better than she did.   Sleep better now without meds. No concerns about the meds.  Vyvanse helps but wonders if she' more tolerant to it.  Can't take more topomax without cog problems.  Still binge eats.  Generally not depressed.  Function is okay.  Still gets anxious and stressed but has triggers for that.  Tolerates meds.  No change in binge eating pattern. Sleep affected by stress and don't want sleep meds. No new meds.  07/17/2020 appointment with following noted: Covid December through holidays.  Ed hospitalized briefly and recovered. Doing OK.  Louis at [employer] and going OK so far. Most of the time feels good.  Big transition having Louis come home and everyone sick. EDO not the best with holidays and stress and change in routine.  Would be great if there was something that could make it go away. Plan no changes  01/02/21 appt noted: About the same.  No local studies available for her to particpate in. No SE. Unless cog SE with topiramate over 50 mg  daily. Patient reports stable mood and denies depressed or irritable moods.  Patient denies any recent difficulty with anxiety.  Patient denies difficulty with sleep initiation or maintenance. Denies appetite disturbance.  Patient reports that energy and motivation have been good.  Patient denies any difficulty with concentration.  Patient denies any suicidal ideation. Still dealing with EDO. Louis is better but still loses control at times in public and at home.  His school went well.  Past Psychiatric Medication Trials: NAC capsules heartburn, topiramate 100 Cog SE  Review of Systems:  Review of Systems  Cardiovascular:  Negative for palpitations.  Gastrointestinal:  Negative for constipation.  Neurological:  Negative for dizziness, tremors, weakness and headaches.  Psychiatric/Behavioral:  Negative for agitation, behavioral problems, confusion, decreased concentration, dysphoric mood, hallucinations, self-injury, sleep disturbance and suicidal ideas. The patient is not nervous/anxious and is not hyperactive.    Medications: I have reviewed the patient's current medications.  Current Outpatient Medications  Medication Sig Dispense Refill   BIOTIN PO Take 1 tablet by mouth daily.      CALCIUM PO Take 1 tablet by mouth daily.     cholecalciferol (VITAMIN D3) 25 MCG (1000 UT) tablet Take 1,000 Units by mouth daily.     levothyroxine (SYNTHROID) 125 MCG tablet Take 1 tablet by mouth once daily 30 tablet 0   loratadine (CLARITIN) 10 MG tablet Take 10 mg by mouth daily.     naltrexone (DEPADE) 50 MG tablet Take 1  tablet (50 mg total) by mouth daily. 30 tablet 2   rizatriptan (MAXALT) 10 MG tablet TAKE 1 TABLET BY MOUTH ONCE DAILY AS NEEDED 10 tablet 0   topiramate (TOPAMAX) 50 MG tablet Take 1 tablet (50 mg total) by mouth 2 (two) times daily. (Patient taking differently: Take 50 mg by mouth 2 (two) times daily. 1 a day) 180 tablet 1   UBRELVY 100 MG TABS      valACYclovir (VALTREX) 500 MG  tablet Take 500 mg by mouth 2 (two) times daily as needed (outbreak).      [START ON 02/27/2021] lisdexamfetamine (VYVANSE) 60 MG capsule Take 1 capsule (60 mg total) by mouth every morning. 30 capsule 0   [START ON 01/30/2021] lisdexamfetamine (VYVANSE) 60 MG capsule Take 1 capsule (60 mg total) by mouth every morning. 30 capsule 0   lisdexamfetamine (VYVANSE) 60 MG capsule Take 1 capsule (60 mg total) by mouth every morning. 30 capsule 0   sertraline (ZOLOFT) 100 MG tablet Take 2 tablets (200 mg total) by mouth daily. 180 tablet 1   No current facility-administered medications for this visit.    Medication Side Effects: None  Allergies: No Known Allergies  Past Medical History:  Diagnosis Date   Migraine    Unspecified hypothyroidism     Family History  Problem Relation Age of Onset   Alcohol abuse Mother    Cancer Mother        lung   Heart disease Mother    Hypertension Mother    Hyperlipidemia Father    Heart disease Father    Hypertension Father    Diabetes Father    Cancer Other    Hypertension Other    Hyperlipidemia Other    Heart attack Other    Stroke Other     Social History   Socioeconomic History   Marital status: Married    Spouse name: Not on file   Number of children: 1   Years of education: Not on file   Highest education level: Not on file  Occupational History    Employer: UNEMPLOYED  Tobacco Use   Smoking status: Former    Pack years: 0.00   Smokeless tobacco: Never  Vaping Use   Vaping Use: Never used  Substance and Sexual Activity   Alcohol use: Yes    Comment: occasionally wine   Drug use: No   Sexual activity: Not on file  Other Topics Concern   Not on file  Social History Narrative   Not on file   Social Determinants of Health   Financial Resource Strain: Not on file  Food Insecurity: Not on file  Transportation Needs: Not on file  Physical Activity: Not on file  Stress: Not on file  Social Connections: Not on file  Intimate  Partner Violence: Not on file    Past Medical History, Surgical history, Social history, and Family history were reviewed and updated as appropriate.   Please see review of systems for further details on the patient's review from today.   Objective:   Physical Exam:  BP (!) 135/96   Pulse 66   Physical Exam Constitutional:      General: She is not in acute distress.    Appearance: She is well-developed.  Musculoskeletal:        General: No deformity.  Neurological:     Mental Status: She is alert and oriented to person, place, and time.     Motor: No tremor.     Coordination:  Coordination normal.     Gait: Gait normal.  Psychiatric:        Attention and Perception: Attention and perception normal.        Mood and Affect: Mood is not anxious or depressed. Affect is not labile, blunt, angry or inappropriate.        Speech: Speech normal.        Behavior: Behavior normal.        Thought Content: Thought content normal. Thought content does not include homicidal or suicidal ideation. Thought content does not include homicidal or suicidal plan.        Cognition and Memory: Cognition normal.        Judgment: Judgment normal.     Comments: Insight intact. No auditory or visual hallucinations. No delusions.  Anxiety manageable.   Lou  Lab Review:     Component Value Date/Time   NA 140 11/08/2019 0829   K 3.7 11/08/2019 0829   CL 102 11/08/2019 0829   CO2 28 11/08/2019 0829   GLUCOSE 83 11/08/2019 0829   BUN 17 11/08/2019 0829   CREATININE 0.88 11/08/2019 0829   CALCIUM 9.8 11/08/2019 0829   PROT 6.6 11/08/2019 0829   ALBUMIN 4.5 11/08/2019 0829   AST 18 11/08/2019 0829   ALT 11 11/08/2019 0829   ALKPHOS 48 11/08/2019 0829   BILITOT 0.6 11/08/2019 0829   GFRNONAA >60 01/10/2015 1910   GFRAA >60 01/10/2015 1910       Component Value Date/Time   WBC 3.9 (L) 11/08/2019 0829   RBC 4.75 11/08/2019 0829   HGB 14.7 11/08/2019 0829   HCT 43.2 11/08/2019 0829   PLT  218.0 11/08/2019 0829   MCV 90.8 11/08/2019 0829   MCH 30.8 01/10/2015 1910   MCHC 34.0 11/08/2019 0829   RDW 12.8 11/08/2019 0829   LYMPHSABS 1.4 11/08/2019 0829   MONOABS 0.3 11/08/2019 0829   EOSABS 0.1 11/08/2019 0829   BASOSABS 0.0 11/08/2019 0829    No results found for: POCLITH, LITHIUM   No results found for: PHENYTOIN, PHENOBARB, VALPROATE, CBMZ   .res Assessment: Plan:    Barth Kirkseri was seen today for follow-up, anxiety and eating disorder.  Diagnoses and all orders for this visit:  Generalized anxiety disorder -     sertraline (ZOLOFT) 100 MG tablet; Take 2 tablets (200 mg total) by mouth daily.  Binge eating disorder -     lisdexamfetamine (VYVANSE) 60 MG capsule; Take 1 capsule (60 mg total) by mouth every morning. -     lisdexamfetamine (VYVANSE) 60 MG capsule; Take 1 capsule (60 mg total) by mouth every morning. -     lisdexamfetamine (VYVANSE) 60 MG capsule; Take 1 capsule (60 mg total) by mouth every morning. -     sertraline (ZOLOFT) 100 MG tablet; Take 2 tablets (200 mg total) by mouth daily. -     naltrexone (DEPADE) 50 MG tablet; Take 1 tablet (50 mg total) by mouth daily.  Insomnia due to mental condition  Supportive therapy dealing with son and COVID and being isolated. Recovered but Ed ended up in hospital.  Unlikely other meds for EDO would help.  The 2 best are Vyvanse and topiramate.  No indication for med change except at noted. Vyvanse does help her with the binge eating but it does not solve the problem.  She has had counseling.  She alternates between 60 and 70 mg.  At times to 70 mg seems to be a little too high making her feel jittery.  The other reason she alternates is to reduce the risk of tolerance.   Anxiety is generally manageable with the sertraline it clearly helps.  Couldn't tolerate NAC DT heartburn.  May try again with tablets.  This will be off label for the binge eating compulsiveness.  Disc insomnia as risk factor for mood poor  impulse control with binge eating, cognitive risks. Trazodone 50-100 trial for sleep.  Disc SE  Disc off label potential of Ozempic for binge eating may become a possibility in the future.  No recent trials available. Disc potential off label naltrexone 25 mg - 50 mg q PM.  Disc SE and she agrees  FU 6 mos   Meredith Staggers, MD, DFAPA  Please see After Visit Summary for patient specific instructions.  No future appointments.  No orders of the defined types were placed in this encounter.     -------------------------------

## 2021-01-16 ENCOUNTER — Ambulatory Visit: Payer: Self-pay | Admitting: Psychiatry

## 2021-02-22 ENCOUNTER — Other Ambulatory Visit: Payer: Self-pay | Admitting: Family Medicine

## 2021-03-06 ENCOUNTER — Other Ambulatory Visit: Payer: Self-pay | Admitting: Registered Nurse

## 2021-03-06 ENCOUNTER — Encounter: Payer: Self-pay | Admitting: Registered Nurse

## 2021-03-06 ENCOUNTER — Ambulatory Visit (INDEPENDENT_AMBULATORY_CARE_PROVIDER_SITE_OTHER): Payer: 59 | Admitting: Registered Nurse

## 2021-03-06 ENCOUNTER — Other Ambulatory Visit: Payer: Self-pay

## 2021-03-06 VITALS — BP 137/89 | HR 69 | Temp 98.2°F | Resp 18 | Ht 64.0 in | Wt 132.8 lb

## 2021-03-06 DIAGNOSIS — Z130 Encounter for screening for diseases of the blood and blood-forming organs and certain disorders involving the immune mechanism: Secondary | ICD-10-CM | POA: Diagnosis not present

## 2021-03-06 DIAGNOSIS — G43909 Migraine, unspecified, not intractable, without status migrainosus: Secondary | ICD-10-CM | POA: Diagnosis not present

## 2021-03-06 DIAGNOSIS — Z13220 Encounter for screening for lipoid disorders: Secondary | ICD-10-CM

## 2021-03-06 DIAGNOSIS — Z23 Encounter for immunization: Secondary | ICD-10-CM | POA: Diagnosis not present

## 2021-03-06 DIAGNOSIS — Z1329 Encounter for screening for other suspected endocrine disorder: Secondary | ICD-10-CM

## 2021-03-06 DIAGNOSIS — E782 Mixed hyperlipidemia: Secondary | ICD-10-CM

## 2021-03-06 DIAGNOSIS — E039 Hypothyroidism, unspecified: Secondary | ICD-10-CM

## 2021-03-06 DIAGNOSIS — Z13228 Encounter for screening for other metabolic disorders: Secondary | ICD-10-CM | POA: Diagnosis not present

## 2021-03-06 LAB — CBC WITH DIFFERENTIAL/PLATELET
Basophils Absolute: 0 10*3/uL (ref 0.0–0.1)
Basophils Relative: 1 % (ref 0.0–3.0)
Eosinophils Absolute: 0.1 10*3/uL (ref 0.0–0.7)
Eosinophils Relative: 3.4 % (ref 0.0–5.0)
HCT: 41.9 % (ref 36.0–46.0)
Hemoglobin: 14.1 g/dL (ref 12.0–15.0)
Lymphocytes Relative: 37.7 % (ref 12.0–46.0)
Lymphs Abs: 1.5 10*3/uL (ref 0.7–4.0)
MCHC: 33.5 g/dL (ref 30.0–36.0)
MCV: 91.7 fl (ref 78.0–100.0)
Monocytes Absolute: 0.3 10*3/uL (ref 0.1–1.0)
Monocytes Relative: 7.8 % (ref 3.0–12.0)
Neutro Abs: 2 10*3/uL (ref 1.4–7.7)
Neutrophils Relative %: 50.1 % (ref 43.0–77.0)
Platelets: 224 10*3/uL (ref 150.0–400.0)
RBC: 4.57 Mil/uL (ref 3.87–5.11)
RDW: 13.1 % (ref 11.5–15.5)
WBC: 3.9 10*3/uL — ABNORMAL LOW (ref 4.0–10.5)

## 2021-03-06 LAB — COMPREHENSIVE METABOLIC PANEL
ALT: 11 U/L (ref 0–35)
AST: 18 U/L (ref 0–37)
Albumin: 4.2 g/dL (ref 3.5–5.2)
Alkaline Phosphatase: 46 U/L (ref 39–117)
BUN: 14 mg/dL (ref 6–23)
CO2: 29 mEq/L (ref 19–32)
Calcium: 9.4 mg/dL (ref 8.4–10.5)
Chloride: 100 mEq/L (ref 96–112)
Creatinine, Ser: 0.95 mg/dL (ref 0.40–1.20)
GFR: 71.26 mL/min (ref >60.00)
Glucose, Bld: 70 mg/dL (ref 70–99)
Potassium: 4.4 mEq/L (ref 3.5–5.1)
Sodium: 137 mEq/L (ref 135–145)
Total Bilirubin: 0.5 mg/dL (ref 0.2–1.2)
Total Protein: 6.7 g/dL (ref 6.0–8.3)

## 2021-03-06 LAB — LIPID PANEL
Cholesterol: 270 mg/dL — ABNORMAL HIGH (ref 0–200)
HDL: 78.1 mg/dL (ref >39.00)
LDL Cholesterol: 156 mg/dL — ABNORMAL HIGH (ref 0–99)
NonHDL: 191.4
Total CHOL/HDL Ratio: 3
Triglycerides: 176 mg/dL — ABNORMAL HIGH (ref 0.0–149.0)
VLDL: 35.2 mg/dL (ref 0.0–40.0)

## 2021-03-06 LAB — TSH: TSH: 33.19 u[IU]/mL — ABNORMAL HIGH (ref 0.35–5.50)

## 2021-03-06 LAB — HEMOGLOBIN A1C: Hgb A1c MFr Bld: 5.3 % (ref 4.6–6.5)

## 2021-03-06 MED ORDER — LEVOTHYROXINE SODIUM 125 MCG PO TABS: 125.0000 ug | ORAL_TABLET | Freq: Every day | ORAL | 0 refills | Status: DC

## 2021-03-06 MED ORDER — LEVOTHYROXINE SODIUM 137 MCG PO TABS: 137.0000 ug | ORAL_TABLET | Freq: Every day | ORAL | 0 refills | Status: DC

## 2021-03-06 MED ORDER — ROSUVASTATIN CALCIUM 10 MG PO TABS: 10.0000 mg | ORAL_TABLET | Freq: Every day | ORAL | 3 refills | Status: DC

## 2021-03-06 MED ORDER — RIZATRIPTAN BENZOATE 10 MG PO TABS: 10.0000 mg | ORAL_TABLET | Freq: Every day | ORAL | 2 refills | Status: DC | PRN

## 2021-03-06 NOTE — Patient Instructions (Addendum)
Ms. Katherine [name] -  Randie Heinz to meet you  Call me if you have concerns  Labs should be back this afternoon. I'll let you know if there's any changes warranted  Follow up as scheduled with Dr. Beverely Low  Thank you  Rich     If you have lab work done today you will be contacted with your lab results within the next 2 weeks.  If you have not heard from [place] then please contact us. The fastest way to get your results is to register for My Chart.   IF you received an x-ray today, you will receive an invoice from [facility]. Please contact [facility] at [phone] with questions or concerns regarding your invoice.   IF you received labwork today, you will receive an invoice from [facility]. Please contact [facility] at [phone] with questions or concerns regarding your invoice.   Our billing staff will not be able to assist you with questions regarding bills from these companies.  You will be contacted with the lab results as soon as they are available. The fastest way to get your results is to activate your My Chart account. Instructions are located on the last page of this paperwork. If you have not heard from [place] regarding the results in 2 weeks, please contact this office.

## 2021-03-06 NOTE — Progress Notes (Signed)
Established Patient Office Visit  Subjective:  Patient ID: Katherine [name], female    DOB: 1976-04-22  Age: 45 y.o. MRN: [id number]  CC:  Chief Complaint  Patient presents with   Medication Refill    Patient states she is here for a medication refill and also a flu shot.    HPI Katherine [name] presents for med refill   Hx of migraines Rizatriptan 10mg  po qd prn, repeats if needed Using intermittently. No AE  Hypothyroidism Doing well po qd No AE, no symptoms of note Otherwise feeling well  BP borderline today. Will continue to monitor  Past Medical History:  Diagnosis Date   Migraine    Unspecified hypothyroidism     Past Surgical History:  Procedure Laterality Date   MANDIBLE SURGERY      Family History  Problem Relation Age of Onset   Alcohol abuse Mother    Cancer Mother        lung   Heart disease Mother    Hypertension Mother    Hyperlipidemia Father    Heart disease Father    Hypertension Father    Diabetes Father    Cancer Other    Hypertension Other    Hyperlipidemia Other    Heart attack Other    Stroke Other     Social History   Socioeconomic History   Marital status: Married    Spouse name: Not on file   Number of children: 1   Years of education: Not on file   Highest education level: Not on file  Occupational History    Employer: UNEMPLOYED  Tobacco Use   Smoking status: Former   Smokeless tobacco: Never  Use: Never used  Substance and Sexual Activity   Alcohol use: Yes    Comment: occasionally wine   Drug use: No   Sexual activity: Not on file  Other Topics Concern   Not on file  Social History Narrative   Not on file   Social Determinants of Health   Financial Resource Strain: Not on file  Food Insecurity: Not on file  Transportation Needs: Not on file  Physical Activity: Not on file  Stress: Not on file  Social Connections: Not on file  Intimate Partner Violence: Not on file     Outpatient Medications Prior to Visit  Medication Sig Dispense Refill   BIOTIN PO Take 1 tablet by mouth daily.      CALCIUM PO Take 1 tablet by mouth daily.     cholecalciferol (VITAMIN D3) 25 MCG (1000 UT) tablet Take 1,000 Units by mouth daily.     lisdexamfetamine (VYVANSE) 60 MG capsule Take 1 capsule (60 mg total) by mouth every morning. 30 capsule 0   lisdexamfetamine (VYVANSE) 60 MG capsule Take 1 capsule (60 mg total) by mouth every morning. 30 capsule 0   lisdexamfetamine (VYVANSE) 60 MG capsule Take 1 capsule (60 mg total) by mouth every morning. 30 capsule 0   loratadine (CLARITIN) 10 MG tablet Take 10 mg by mouth daily.     naltrexone (DEPADE) 50 MG tablet Take 1 tablet (50 mg total) by mouth daily. 30 tablet 2   sertraline (ZOLOFT) 100 MG tablet Take 2 tablets (200 mg total) by mouth daily. 180 tablet 1   topiramate (TOPAMAX) 50 MG tablet Take 1 tablet (50 mg total) by mouth 2 (two) times daily. (Patient taking differently: Take 50 mg by mouth 2 (two) times daily. 1 a day)  180 tablet 1   UBRELVY 100 MG TABS      valACYclovir (VALTREX) 500 MG tablet Take 500 mg by mouth 2 (two) times daily as needed (outbreak).      levothyroxine (SYNTHROID) 125 MCG tablet Take 1 tablet by mouth once daily 30 tablet 0   rizatriptan (MAXALT) 10 MG tablet TAKE 1 TABLET BY MOUTH ONCE DAILY AS NEEDED 10 tablet 0   No facility-administered medications prior to visit.    Not on File  ROS Review of Systems  Constitutional: Negative.   HENT: Negative.    Eyes: Negative.   Respiratory: Negative.    Cardiovascular: Negative.   Gastrointestinal: Negative.   Genitourinary: Negative.   Musculoskeletal: Negative.   Skin: Negative.   Neurological: Negative.   Psychiatric/Behavioral: Negative.    All other systems reviewed and are negative.    Objective:    Physical Exam Vitals and nursing note reviewed.  Constitutional:      General: She is not in acute distress.    Appearance: Normal  appearance. She is normal weight. She is not ill-appearing, toxic-appearing or diaphoretic.  Cardiovascular:     Rate and Rhythm: Normal rate and regular rhythm.     Heart sounds: Normal heart sounds. No murmur heard.   No friction rub. No gallop.  Pulmonary:     Effort: Pulmonary effort is normal. No respiratory distress.     Breath sounds: Normal breath sounds. No stridor. No wheezing, rhonchi or rales.  Chest:     Chest wall: No tenderness.  Skin:    General: Skin is warm and dry.  Neurological:     General: No focal deficit present.     Mental Status: She is alert and oriented to person, place, and time. Mental status is at baseline.  Psychiatric:        Mood and Affect: Mood normal.        Behavior: Behavior normal.        Thought Content: Thought content normal.        Judgment: Judgment normal.    BP 137/89   Pulse 69   Temp 98.2 F (36.8 C) (Temporal)   Resp 18   Ht 5\' 4"  (1.626 m)   Wt 132 lb 12.8 oz (60.2 kg)   SpO2 100%   BMI 22.80 kg/m  Wt Readings from Last 3 Encounters:  03/06/21 132 lb 12.8 oz (60.2 kg)  06/16/19 124 lb (56.2 kg)  06/10/18 128 lb 4 oz (58.2 kg)     Health Maintenance Due  Topic Date Due   INFLUENZA VACCINE  01/22/2021    There are no preventive care reminders to display for this patient.  Lab Results  Component Value Date   TSH 4.46 03/03/2020   Lab Results  Component Value Date   WBC 3.9 (L) 11/08/2019   HGB 14.7 11/08/2019   HCT 43.2 11/08/2019   MCV 90.8 11/08/2019   PLT 218.0 11/08/2019   Lab Results  Component Value Date   NA 140 11/08/2019   K 3.7 11/08/2019   CO2 28 11/08/2019   GLUCOSE 83 11/08/2019   BUN 17 11/08/2019   CREATININE 0.88 11/08/2019   BILITOT 0.6 11/08/2019   ALKPHOS 48 11/08/2019   AST 18 11/08/2019   ALT 11 11/08/2019   PROT 6.6 11/08/2019   ALBUMIN 4.5 11/08/2019   CALCIUM 9.8 11/08/2019   ANIONGAP 5 01/10/2015   GFR 69.07 11/08/2019   Lab Results  Component Value Date   CHOL 275  (  H) 06/22/2019   Lab Results  Component Value Date   HDL 94.30 06/22/2019   Lab Results  Component Value Date   LDLCALC 159 (H) 06/22/2019   Lab Results  Component Value Date   TRIG 110.0 06/22/2019   Lab Results  Component Value Date   CHOLHDL 3 06/22/2019   No results found for: HGBA1C    Assessment & Plan:   Problem List Items Addressed This Visit       Cardiovascular and Mediastinum   Migraines   Relevant Medications   rizatriptan (MAXALT) 10 MG tablet     Endocrine   Hypothyroidism - Primary   Relevant Medications   levothyroxine (SYNTHROID) 125 MCG tablet   Other Relevant Orders   TSH   Other Visit Diagnoses     Screening for endocrine, metabolic and immunity disorder       Relevant Orders   CBC with Differential/Platelet   Comprehensive metabolic panel   Hemoglobin A1c   Lipid screening       Relevant Orders   Lipid panel       Meds ordered this encounter  Medications   levothyroxine (SYNTHROID) 125 MCG tablet    Sig: Take 1 tablet (125 mcg total) by mouth daily.    Dispense:  90 tablet    Refill:  0    Patient needs to schedule an appt for further refills    Order Specific Question:   Supervising Provider    Answer:   Neva Seat, JEFFREY R [[id number]]   rizatriptan (MAXALT) 10 MG tablet    Sig: Take 1 tablet (10 mg total) by mouth daily as needed. May repeat in 2 hours if needed    Dispense:  10 tablet    Refill:  2    Order Specific Question:   Supervising Provider    Answer:   Neva Seat, JEFFREY R [[id number]]    Follow-up: Return for as scheduled with PCP.   PLAN Labs collected. Will follow up with the patient as warranted. Refill sythroid and rizatriptan Retun as scheduled for CPE w PCP Patient encouraged to call clinic with any questions, comments, or concerns.  Janeece Agee, NP

## 2021-03-16 ENCOUNTER — Telehealth: Payer: 59 | Admitting: Family

## 2021-03-16 DIAGNOSIS — B9689 Other specified bacterial agents as the cause of diseases classified elsewhere: Secondary | ICD-10-CM

## 2021-03-16 DIAGNOSIS — J208 Acute bronchitis due to other specified organisms: Secondary | ICD-10-CM

## 2021-03-16 MED ORDER — PREDNISONE 10 MG (21) PO TBPK: ORAL_TABLET | ORAL | 0 refills | Status: DC

## 2021-03-16 MED ORDER — AZITHROMYCIN 250 MG PO TABS: ORAL_TABLET | ORAL | 0 refills | Status: DC

## 2021-03-16 MED ORDER — BENZONATATE 100 MG PO CAPS: 100.0000 mg | ORAL_CAPSULE | Freq: Three times a day (TID) | ORAL | 0 refills | Status: DC | PRN

## 2021-03-16 NOTE — Progress Notes (Signed)
We are sorry that you are not feeling well.  Here is how we plan to help!  Based on your presentation I believe you most likely have A cough due to bacteria.  When patients have a fever and a productive cough with a change in color or increased sputum production, we are concerned about bacterial bronchitis.  If left untreated it can progress to pneumonia.  If your symptoms do not improve with your treatment plan it is important that you contact your provider.   I have prescribed Azithromyin 250 mg: two tablets now and then one tablet daily for 4 additonal days    In addition you may use A non-prescription cough medication called Robitussin DAC. Take 2 teaspoons every 8 hours or Delsym: take 2 teaspoons every 12 hours., A non-prescription cough medication called Mucinex DM: take 2 tablets every 12 hours., and A prescription cough medication called Tessalon Perles 100mg. You may take 1-2 capsules every 8 hours as needed for your cough.  Prednisone 10 mg daily for 6 days (see taper instructions below)  Directions for 6 day taper: Day 1: 2 tablets before breakfast, 1 after both lunch & dinner and 2 at bedtime Day 2: 1 tab before breakfast, 1 after both lunch & dinner and 2 at bedtime Day 3: 1 tab at each meal & 1 at bedtime Day 4: 1 tab at breakfast, 1 at lunch, 1 at bedtime Day 5: 1 tab at breakfast & 1 tab at bedtime Day 6: 1 tab at breakfast  From your responses in the eVisit questionnaire you describe inflammation in the upper respiratory tract which is causing a significant cough.  This is commonly called Bronchitis and has four common causes:   Allergies Viral Infections Acid Reflux Bacterial Infection Allergies, viruses and acid reflux are treated by controlling symptoms or eliminating the cause. An example might be a cough caused by taking certain blood pressure medications. You stop the cough by changing the medication. Another example might be a cough caused by acid reflux. Controlling the  reflux helps control the cough.  USE OF BRONCHODILATOR ("RESCUE") INHALERS: There is a risk from using your bronchodilator too frequently.  The risk is that over-reliance on a medication which only relaxes the muscles surrounding the breathing tubes can reduce the effectiveness of medications prescribed to reduce swelling and congestion of the tubes themselves.  Although you feel brief relief from the bronchodilator inhaler, your asthma may actually be worsening with the tubes becoming more swollen and filled with mucus.  This can delay other crucial treatments, such as oral steroid medications. If you need to use a bronchodilator inhaler daily, several times per day, you should discuss this with your provider.  There are probably better treatments that could be used to keep your asthma under control.     HOME CARE Only take medications as instructed by your medical team. Complete the entire course of an antibiotic. Drink plenty of fluids and get plenty of rest. Avoid close contacts especially the very young and the elderly Cover your mouth if you cough or cough into your sleeve. Always remember to wash your hands A steam or ultrasonic humidifier can help congestion.   GET HELP RIGHT AWAY IF: You develop worsening fever. You become short of breath You cough up blood. Your symptoms persist after you have completed your treatment plan MAKE SURE YOU  Understand these instructions. Will watch your condition. Will get help right away if you are not doing well or get worse.      Thank you for choosing an e-visit.  Your e-visit answers were reviewed by a board certified advanced clinical practitioner to complete your personal care plan. Depending upon the condition, your plan could have included both over the counter or prescription medications.  Please review your pharmacy choice. Make sure the pharmacy is open so you can pick up prescription now. If there is a problem, you may contact your  provider through MyChart messaging and have the prescription routed to another pharmacy.  Your safety is important to us. If you have drug allergies check your prescription carefully.   For the next 24 hours you can use MyChart to ask questions about today's visit, request a non-urgent call back, or ask for a work or school excuse. You will get an email in the next two days asking about your experience. I hope that your e-visit has been valuable and will speed your recovery.  .Approximately 5 minutes was spent documenting and reviewing patient's chart.    

## 2021-04-05 ENCOUNTER — Telehealth: Payer: Self-pay

## 2021-04-05 NOTE — Telephone Encounter (Signed)
Caller name:[facility]  Caller callback [phone] Fax [phone] Last Appt 09/13 NEXT APPOINTMENT DATE: 11/21  MEDICATION NAME & DOSE:rizatriptan (MAXALT) 10 MG tablet  Please note can this be switched to DAW0 instead of DAW1 so they can sub so they can give her generic  Is the patient out of medication? no  PHARMACY: [facility] [address], [place] - [address]  [address] [place], [place] [address]

## 2021-04-06 ENCOUNTER — Other Ambulatory Visit: Payer: Self-pay

## 2021-04-06 ENCOUNTER — Other Ambulatory Visit: Payer: Self-pay | Admitting: Family Medicine

## 2021-04-06 DIAGNOSIS — G43909 Migraine, unspecified, not intractable, without status migrainosus: Secondary | ICD-10-CM

## 2021-04-06 MED ORDER — RIZATRIPTAN BENZOATE 10 MG PO TABS: 10.0000 mg | ORAL_TABLET | Freq: Every day | ORAL | 2 refills | Status: DC | PRN

## 2021-04-06 NOTE — Progress Notes (Signed)
Med filled as requested by pharmacy

## 2021-04-06 NOTE — Telephone Encounter (Signed)
Rx has been filled and sent to patient pharmacy. 

## 2021-04-30 ENCOUNTER — Other Ambulatory Visit: Payer: Self-pay | Admitting: Psychiatry

## 2021-04-30 DIAGNOSIS — F5081 Binge eating disorder: Secondary | ICD-10-CM

## 2021-04-30 MED ORDER — LISDEXAMFETAMINE DIMESYLATE 60 MG PO CAPS: 60.0000 mg | ORAL_CAPSULE | ORAL | 0 refills | Status: DC

## 2021-04-30 NOTE — Telephone Encounter (Signed)
Katherine [name] called to request refill of her Vyvanse.  Appt 07/05/21.  Send to [facility] °

## 2021-05-13 ENCOUNTER — Encounter: Payer: Self-pay | Admitting: *Deleted

## 2021-05-14 ENCOUNTER — Encounter: Payer: 59 | Admitting: Family Medicine

## 2021-05-31 ENCOUNTER — Telehealth: Payer: Self-pay | Admitting: Psychiatry

## 2021-05-31 ENCOUNTER — Other Ambulatory Visit: Payer: Self-pay

## 2021-05-31 DIAGNOSIS — F5081 Binge eating disorder: Secondary | ICD-10-CM

## 2021-05-31 MED ORDER — LISDEXAMFETAMINE DIMESYLATE 60 MG PO CAPS: 60.0000 mg | ORAL_CAPSULE | ORAL | 0 refills | Status: DC

## 2021-05-31 NOTE — Telephone Encounter (Signed)
Pt called for a refill on her vyvanse 60 mg to be sent to the [facility] [address]. Next appt 1/4

## 2021-05-31 NOTE — Telephone Encounter (Signed)
Pended.

## 2021-06-20 ENCOUNTER — Telehealth: Payer: Self-pay | Admitting: Psychiatry

## 2021-06-20 ENCOUNTER — Other Ambulatory Visit: Payer: Self-pay

## 2021-06-20 NOTE — Telephone Encounter (Signed)
Last filled 12/8 due 06/28/21

## 2021-06-20 NOTE — Telephone Encounter (Signed)
Katherine [name] called to request refill of her Vyvanse.  Appt 07/05/21.  Send to [facility]

## 2021-06-27 ENCOUNTER — Telehealth: Payer: Self-pay

## 2021-06-27 DIAGNOSIS — F5081 Binge eating disorder: Secondary | ICD-10-CM

## 2021-06-27 MED ORDER — LISDEXAMFETAMINE DIMESYLATE 60 MG PO CAPS: 60.0000 mg | ORAL_CAPSULE | ORAL | 0 refills | Status: DC

## 2021-06-27 NOTE — Telephone Encounter (Signed)
Pended.

## 2021-06-28 ENCOUNTER — Ambulatory Visit (INDEPENDENT_AMBULATORY_CARE_PROVIDER_SITE_OTHER): Payer: Managed Care, Other (non HMO) | Admitting: Family Medicine

## 2021-06-28 ENCOUNTER — Encounter: Payer: Self-pay | Admitting: Family Medicine

## 2021-06-28 VITALS — BP 122/82 | HR 78 | Temp 99.7°F | Resp 16 | Ht 64.0 in | Wt 133.2 lb

## 2021-06-28 DIAGNOSIS — Z1211 Encounter for screening for malignant neoplasm of colon: Secondary | ICD-10-CM | POA: Diagnosis not present

## 2021-06-28 DIAGNOSIS — Z0000 Encounter for general adult medical examination without abnormal findings: Secondary | ICD-10-CM

## 2021-06-28 DIAGNOSIS — E7800 Pure hypercholesterolemia, unspecified: Secondary | ICD-10-CM | POA: Diagnosis not present

## 2021-06-28 LAB — CBC WITH DIFFERENTIAL/PLATELET
Basophils Absolute: 0 10*3/uL (ref 0.0–0.1)
Basophils Relative: 0.7 % (ref 0.0–3.0)
Eosinophils Absolute: 0.1 10*3/uL (ref 0.0–0.7)
Eosinophils Relative: 2.1 % (ref 0.0–5.0)
HCT: 40.3 % (ref 36.0–46.0)
Hemoglobin: 13.4 g/dL (ref 12.0–15.0)
Lymphocytes Relative: 30.3 % (ref 12.0–46.0)
Lymphs Abs: 1.5 10*3/uL (ref 0.7–4.0)
MCHC: 33.1 g/dL (ref 30.0–36.0)
MCV: 91 fl (ref 78.0–100.0)
Monocytes Absolute: 0.3 10*3/uL (ref 0.1–1.0)
Monocytes Relative: 6.8 % (ref 3.0–12.0)
Neutro Abs: 3 10*3/uL (ref 1.4–7.7)
Neutrophils Relative %: 60.1 % (ref 43.0–77.0)
Platelets: 247 10*3/uL (ref 150.0–400.0)
RBC: 4.43 Mil/uL (ref 3.87–5.11)
RDW: 12.8 % (ref 11.5–15.5)
WBC: 5 10*3/uL (ref 4.0–10.5)

## 2021-06-28 LAB — BASIC METABOLIC PANEL
BUN: 15 mg/dL (ref 6–23)
CO2: 30 mEq/L (ref 19–32)
Calcium: 9.2 mg/dL (ref 8.4–10.5)
Chloride: 103 mEq/L (ref 96–112)
Creatinine, Ser: 0.86 mg/dL (ref 0.40–1.20)
GFR: 80.12 mL/min (ref >60.00)
Glucose, Bld: 85 mg/dL (ref 70–99)
Potassium: 4.3 mEq/L (ref 3.5–5.1)
Sodium: 139 mEq/L (ref 135–145)

## 2021-06-28 LAB — TSH: TSH: 1.48 u[IU]/mL (ref 0.35–5.50)

## 2021-06-28 LAB — LIPID PANEL
Cholesterol: 199 mg/dL (ref 0–200)
HDL: 98.7 mg/dL (ref >39.00)
LDL Cholesterol: 86 mg/dL (ref 0–99)
NonHDL: 100.11
Total CHOL/HDL Ratio: 2
Triglycerides: 70 mg/dL (ref 0.0–149.0)
VLDL: 14 mg/dL (ref 0.0–40.0)

## 2021-06-28 LAB — HEPATIC FUNCTION PANEL
ALT: 12 U/L (ref 0–35)
AST: 15 U/L (ref 0–37)
Albumin: 4.1 g/dL (ref 3.5–5.2)
Alkaline Phosphatase: 45 U/L (ref 39–117)
Bilirubin, Direct: 0.1 mg/dL (ref 0.0–0.3)
Total Bilirubin: 0.3 mg/dL (ref 0.2–1.2)
Total Protein: 6.4 g/dL (ref 6.0–8.3)

## 2021-06-28 MED ORDER — VALACYCLOVIR HCL 500 MG PO TABS: 500.0000 mg | ORAL_TABLET | Freq: Two times a day (BID) | ORAL | 1 refills | Status: DC | PRN

## 2021-06-28 MED ORDER — BETAMETHASONE DIPROPIONATE 0.05 % EX OINT: TOPICAL_OINTMENT | Freq: Two times a day (BID) | CUTANEOUS | 1 refills | Status: DC

## 2021-06-28 NOTE — Assessment & Plan Note (Signed)
Chronic problem.  Tolerating statin w/o difficulty.  Check labs.  Adjust meds prn  

## 2021-06-28 NOTE — Patient Instructions (Addendum)
Follow up in 6 months to recheck cholesterol or thyroid We'll notify you of your lab results and make any changes if needed Continue to work on healthy diet and regular exercise- you look great! We'll call you with your GI appt for the colonoscopy consultation Call and schedule your pap and mammo at your convenience Call with any questions or concerns Stay Safe!  Stay Healthy! Happy New Year! Happy Birthday!!!

## 2021-06-28 NOTE — Assessment & Plan Note (Signed)
Pt's PE WNL.  Due for pap and mammo- pt to schedule.  Due for colonoscopy- referral placed.  Check labs.  Anticipatory guidance provided.

## 2021-06-28 NOTE — Progress Notes (Signed)
° °  Subjective:    Patient ID: Katherine [name], female    DOB: 1975/10/03, 46 y.o.   MRN: [id number]  HPI CPE- due for pap and mammo.  Due for colonoscopy.  No concerns today.  Patient Care Team    Relationship Specialty Notifications Start End  Midge Minium, MD PCP - General Family Medicine  10/07/13   Maisie Fus, MD Consulting Physician Obstetrics and Gynecology  06/02/15     Health Maintenance  Topic Date Due   COVID-19 Vaccine (3 - Booster for Pfizer series) 12/02/2019   PAP SMEAR-Modifier  09/20/2020   MAMMOGRAM  03/06/2022 (Originally 09/21/2018)   COLONOSCOPY (Pts 45-68yrs Insurance coverage will need to be confirmed)  03/06/2022 (Originally 06/30/2018)   Hepatitis C Screening  03/06/2022 (Originally 07/01/1991)   HIV Screening  03/06/2022 (Originally 06/30/1988)   TETANUS/TDAP  11/21/2027   INFLUENZA VACCINE  Completed   Pneumococcal Vaccine 17-14 Years old  Aged Out   HPV VACCINES  Aged Out      Review of Systems Patient reports no vision/ hearing changes, adenopathy,fever, weight change,  persistant/recurrent hoarseness , swallowing issues, chest pain, palpitations, edema, persistant/recurrent cough, hemoptysis, dyspnea (rest/exertional/paroxysmal nocturnal), gastrointestinal bleeding (melena, rectal bleeding), abdominal pain, significant heartburn, bowel changes, GU symptoms (dysuria, hematuria, incontinence), Gyn symptoms (abnormal  bleeding, pain),  syncope, focal weakness, memory loss, numbness & tingling, skin/hair/nail changes, abnormal bruising or bleeding, anxiety, or depression.   This visit occurred during the SARS-CoV-2 public health emergency.  Safety protocols were in place, including screening questions prior to the visit, additional usage of staff PPE, and extensive cleaning of exam room while observing appropriate contact time as indicated for disinfecting solutions.      Objective:   Physical Exam General Appearance:    Alert, cooperative, no distress, appears  stated age  Head:    Normocephalic, without obvious abnormality, atraumatic  Eyes:    PERRL, conjunctiva/corneas clear, EOM's intact, fundi    benign, both eyes  Ears:    Normal TM's and external ear canals, both ears  Nose:   Deferred due to COVID  Throat:   Neck:   Supple, symmetrical, trachea midline, no adenopathy;    Thyroid: no enlargement/tenderness/nodules  Back:     Symmetric, no curvature, ROM normal, no CVA tenderness  Lungs:     Clear to auscultation bilaterally, respirations unlabored  Chest Wall:    No tenderness or deformity   Heart:    Regular rate and rhythm, S1 and S2 normal, no murmur, rub   or gallop  Breast Exam:    Deferred to GYN  Abdomen:     Soft, non-tender, bowel sounds active all four quadrants,    no masses, no organomegaly  Genitalia:    Deferred to GYN  Rectal:    Extremities:   Extremities normal, atraumatic, no cyanosis or edema  Pulses:   2+ and symmetric all extremities  Skin:   Skin color, texture, turgor normal, no rashes or lesions  Lymph nodes:   Cervical, supraclavicular, and axillary nodes normal  Neurologic:   CNII-XII intact, normal strength, sensation and reflexes    throughout          Assessment & Plan:

## 2021-06-29 ENCOUNTER — Other Ambulatory Visit: Payer: Self-pay | Admitting: Psychiatry

## 2021-06-29 ENCOUNTER — Telehealth: Payer: Self-pay

## 2021-06-29 DIAGNOSIS — F5081 Binge eating disorder: Secondary | ICD-10-CM

## 2021-06-29 MED ORDER — LISDEXAMFETAMINE DIMESYLATE 60 MG PO CAPS: 60.0000 mg | ORAL_CAPSULE | ORAL | 0 refills | Status: DC

## 2021-06-29 NOTE — Telephone Encounter (Signed)
Did you mean to discontinue this med ?

## 2021-06-29 NOTE — Telephone Encounter (Signed)
-----   Message from Sheliah Hatch, MD sent at 06/29/2021  7:23 AM EST ----- Your cholesterol and TSH look AMAZING!!!  No changes at this time

## 2021-06-29 NOTE — Telephone Encounter (Signed)
Pt called and needs a refill on vyvanse 60 mg to be sent to the [facility]

## 2021-06-29 NOTE — Telephone Encounter (Signed)
Patient aware of labs.  

## 2021-06-29 NOTE — Telephone Encounter (Signed)
sent 

## 2021-07-03 ENCOUNTER — Telehealth: Payer: Self-pay

## 2021-07-03 NOTE — Telephone Encounter (Signed)
PA for lisdexamfetamine (VYVANSE) 60 MG capsule has been approved by [facility]. Effective dates: 07/03/21-07/03/22

## 2021-07-05 ENCOUNTER — Ambulatory Visit (INDEPENDENT_AMBULATORY_CARE_PROVIDER_SITE_OTHER): Payer: 59 | Admitting: Psychiatry

## 2021-07-05 ENCOUNTER — Encounter: Payer: Self-pay | Admitting: Psychiatry

## 2021-07-05 ENCOUNTER — Other Ambulatory Visit: Payer: Self-pay

## 2021-07-05 DIAGNOSIS — F411 Generalized anxiety disorder: Secondary | ICD-10-CM | POA: Diagnosis not present

## 2021-07-05 DIAGNOSIS — F5081 Binge eating disorder: Secondary | ICD-10-CM | POA: Diagnosis not present

## 2021-07-05 DIAGNOSIS — F5105 Insomnia due to other mental disorder: Secondary | ICD-10-CM

## 2021-07-05 MED ORDER — LISDEXAMFETAMINE DIMESYLATE 60 MG PO CAPS: 60.0000 mg | ORAL_CAPSULE | ORAL | 0 refills | Status: DC

## 2021-07-05 MED ORDER — SERTRALINE HCL 100 MG PO TABS: 200.0000 mg | ORAL_TABLET | Freq: Every day | ORAL | 1 refills | Status: DC

## 2021-07-05 MED ORDER — LISDEXAMFETAMINE DIMESYLATE 50 MG PO CAPS
50.0000 mg | ORAL_CAPSULE | Freq: Every day | ORAL | 0 refills | Status: DC
Start: 2021-08-30 — End: 2021-11-12

## 2021-07-05 NOTE — Progress Notes (Signed)
Katherine Paperi L Choung [id number] 04/26/1976 46 y.o.  Subjective:   [name] is a 46 y.o. (DOB 07/17/1975) female.  Chief Complaint:  Chief Complaint  Patient presents with   Follow-up   Anxiety    Anxiety Patient reports no confusion, decreased concentration, dizziness, nervous/anxious behavior, palpitations or suicidal ideas.    Depression        Associated symptoms include no decreased concentration and no suicidal ideas.  Past medical history includes anxiety.   Katherine Paperi L Kuchenbecker presents to the office today for follow-up of  EDO and mood. And stress.  seen in January 2021.  No meds were changed.  01/19/2020 appointment with the following noted: Mood has been OK.  Dog with cancer. Normal sadness. Tolerating meds except lower dose Topomax DT word-finding problems.  less struggles with some depression chronically.  Managing OK.  Can't go to gym which helped her mood.  Handling better than she did.   Sleep better now without meds. No concerns about the meds.  Vyvanse helps but wonders if she' more tolerant to it.  Can't take more topomax without cog problems.  Still binge eats.  Generally not depressed.  Function is okay.  Still gets anxious and stressed but has triggers for that.  Tolerates meds.  No change in binge eating pattern. Sleep affected by stress and don't want sleep meds. No new meds.  07/17/2020 appointment with following noted: Covid December through holidays.  Ed hospitalized briefly and recovered. Doing OK.  Louis at [employer] and going OK so far. Most of the time feels good.  Big transition having Louis come home and everyone sick. EDO not the best with holidays and stress and change in routine.  Would be great if there was something that could make it go away. Plan no changes  01/02/21 appt noted: About the same.  No local studies available for her to particpate in. No SE. Unless cog SE with topiramate over 50 mg daily. Patient reports stable mood  and denies depressed or irritable moods.  Patient denies any recent difficulty with anxiety.  Patient denies difficulty with sleep initiation or maintenance. Denies appetite disturbance.  Patient reports that energy and motivation have been good.  Patient denies any difficulty with concentration.  Patient denies any suicidal ideation. Still dealing with EDO. Louis is better but still loses control at times in public and at home.  His school went well. Plan: Disc potential off label naltrexone 25 mg - 50 mg q PM.  Disc SE and she agrees  07/05/2021 appointment with the following noted: Forgot naltrexone too much so not taking it. Still Vyvanse and sertraline 200 mg daily. Has been fine over all with mood and anxiety. Still dealing with EDO issues. No Se with meds. Patient reports stable mood and denies depressed or irritable moods.  Patient denies any recent difficulty with anxiety.  Patient denies difficulty with sleep initiation or maintenance. Denies appetite disturbance.  Patient reports that energy and motivation have been good.  Patient denies any difficulty with concentration.  Patient denies any suicidal ideation. Still dealing with thyroid problems.  Past Psychiatric Medication Trials: NAC capsules heartburn,  topiramate 100 Cog SE, sertraline 200, naltrexone, Vyvanse  Review of Systems:  Review of Systems  Cardiovascular:  Negative for palpitations.  Gastrointestinal:  Negative for constipation.  Neurological:  Negative for dizziness, tremors and weakness.  Psychiatric/Behavioral:  Negative for agitation, behavioral problems, confusion, decreased concentration, dysphoric mood, hallucinations, self-injury, sleep disturbance and  suicidal ideas. The patient is not nervous/anxious and is not hyperactive.    Medications: I have reviewed the patient's current medications.  Current Outpatient Medications  Medication Sig Dispense Refill   betamethasone dipropionate (DIPROLENE) 0.05 %  ointment Apply topically 2 (two) times daily. 50 g 1   BIOTIN PO Take 1 tablet by mouth daily.      CALCIUM PO Take 1 tablet by mouth daily.     cholecalciferol (VITAMIN D3) 25 MCG (1000 UT) tablet Take 1,000 Units by mouth daily.     levothyroxine (SYNTHROID) 137 MCG tablet Take 1 tablet (137 mcg total) by mouth daily before breakfast. 90 tablet 0   [START ON 08/30/2021] lisdexamfetamine (VYVANSE) 50 MG capsule Take 1 capsule (50 mg total) by mouth daily. 30 capsule 0   [START ON 08/02/2021] lisdexamfetamine (VYVANSE) 60 MG capsule Take 1 capsule (60 mg total) by mouth every morning. 30 capsule 0   loratadine (CLARITIN) 10 MG tablet Take 10 mg by mouth daily.     rizatriptan (MAXALT) 10 MG tablet Take 1 tablet (10 mg total) by mouth daily as needed. May repeat in 2 hours if needed 10 tablet 2   rosuvastatin (CRESTOR) 10 MG tablet Take 1 tablet (10 mg total) by mouth daily. 90 tablet 3   UBRELVY 100 MG TABS      valACYclovir (VALTREX) 500 MG tablet Take 1 tablet (500 mg total) by mouth 2 (two) times daily as needed (outbreak). 60 tablet 1   lisdexamfetamine (VYVANSE) 60 MG capsule Take 1 capsule (60 mg total) by mouth every morning. 30 capsule 0   sertraline (ZOLOFT) 100 MG tablet Take 2 tablets (200 mg total) by mouth daily. 180 tablet 1   No current facility-administered medications for this visit.    Medication Side Effects: None  Allergies: Not on File  Past Medical History:  Diagnosis Date   Migraine    Unspecified hypothyroidism     Family History  Problem Relation Age of Onset   Alcohol abuse Mother    Cancer Mother        lung   Heart disease Mother    Hypertension Mother    Hyperlipidemia Father    Heart disease Father    Hypertension Father    Diabetes Father    Cancer Other    Hypertension Other    Hyperlipidemia Other    Heart attack Other    Stroke Other     Social History   Socioeconomic History   Marital status: Married    Spouse name: Not on file    Number of children: 1   Years of education: Not on file   Highest education level: Not on file  Occupational History    Employer: UNEMPLOYED  Tobacco Use   Smoking status: Former   Smokeless tobacco: Never  Building services engineer Use: Never used  Substance and Sexual Activity   Alcohol use: Yes    Comment: occasionally wine   Drug use: No   Sexual activity: Not on file  Other Topics Concern   Not on file  Social History Narrative   Not on file   Social Determinants of Health   Financial Resource Strain: Not on file  Food Insecurity: Not on file  Transportation Needs: Not on file  Physical Activity: Not on file  Stress: Not on file  Social Connections: Not on file  Intimate Partner Violence: Not on file    Past Medical History, Surgical history, Social history, and Family  history were reviewed and updated as appropriate.   Please see review of systems for further details on the patient's review from today.   Objective:   Physical Exam:  There were no vitals taken for this visit.  Physical Exam Constitutional:      General: She is not in acute distress.    Appearance: She is well-developed.  Musculoskeletal:        General: No deformity.  Neurological:     Mental Status: She is alert and oriented to person, place, and time.     Motor: No tremor.     Coordination: Coordination normal.     Gait: Gait normal.  Psychiatric:        Attention and Perception: Attention and perception normal.        Mood and Affect: Mood is not anxious or depressed. Affect is not labile, blunt, angry or inappropriate.        Speech: Speech normal.        Behavior: Behavior normal.        Thought Content: Thought content normal. Thought content does not include homicidal or suicidal ideation. Thought content does not include suicidal plan.        Cognition and Memory: Cognition normal.        Judgment: Judgment normal.     Comments: Insight intact. No auditory or visual hallucinations.  No delusions.  Anxiety manageable.   Lou  Lab Review:     Component Value Date/Time   NA 139 06/28/2021 1427   K 4.3 06/28/2021 1427   CL 103 06/28/2021 1427   CO2 30 06/28/2021 1427   GLUCOSE 85 06/28/2021 1427   BUN 15 06/28/2021 1427   CREATININE 0.86 06/28/2021 1427   CALCIUM 9.2 06/28/2021 1427   PROT 6.4 06/28/2021 1427   ALBUMIN 4.1 06/28/2021 1427   AST 15 06/28/2021 1427   ALT 12 06/28/2021 1427   ALKPHOS 45 06/28/2021 1427   BILITOT 0.3 06/28/2021 1427   GFRNONAA >60 01/10/2015 1910   GFRAA >60 01/10/2015 1910       Component Value Date/Time   WBC 5.0 06/28/2021 1427   RBC 4.43 06/28/2021 1427   HGB 13.4 06/28/2021 1427   HCT 40.3 06/28/2021 1427   PLT 247.0 06/28/2021 1427   MCV 91.0 06/28/2021 1427   MCH 30.8 01/10/2015 1910   MCHC 33.1 06/28/2021 1427   RDW 12.8 06/28/2021 1427   LYMPHSABS 1.5 06/28/2021 1427   MONOABS 0.3 06/28/2021 1427   EOSABS 0.1 06/28/2021 1427   BASOSABS 0.0 06/28/2021 1427    No results found for: POCLITH, LITHIUM   No results found for: PHENYTOIN, PHENOBARB, VALPROATE, CBMZ   .res Assessment: Plan:    Kyana was seen today for follow-up and anxiety.  Diagnoses and all orders for this visit:  Generalized anxiety disorder -     sertraline (ZOLOFT) 100 MG tablet; Take 2 tablets (200 mg total) by mouth daily.  Binge eating disorder -     lisdexamfetamine (VYVANSE) 60 MG capsule; Take 1 capsule (60 mg total) by mouth every morning. -     lisdexamfetamine (VYVANSE) 60 MG capsule; Take 1 capsule (60 mg total) by mouth every morning. -     lisdexamfetamine (VYVANSE) 50 MG capsule; Take 1 capsule (50 mg total) by mouth daily. -     sertraline (ZOLOFT) 100 MG tablet; Take 2 tablets (200 mg total) by mouth daily.  Insomnia due to mental condition   Supportive therapy dealing with son and COVID  and being isolated. Recovered but Ed ended up in hospital.  Unlikely other meds for EDO would help.  The 2 best are Vyvanse and  topiramate.  She had SE with topiramate.  No indication for med change except at noted. Vyvanse does help her with the binge eating but it does not solve the problem.  She has had counseling.  She alternates between 60 and 70 mg.  At times to 70 mg seems to be a little too high making her feel jittery.  The other reason she alternates is to reduce the risk of tolerance. Disc pending generics.    Anxiety is generally manageable with the sertraline it clearly helps.  Couldn't tolerate NAC DT heartburn.  May try again with tablets.  This will be off label for the binge eating compulsiveness.  Disc off label potential of Ozempic for binge eating may become a possibility in the future.  No recent trials available. Disc potential off label naltrexone 25 mg - 50 mg q PM.  Disc SE and she agrees  FU 6 mos   Meredith Staggersarey Cottle, MD, DFAPA  Please see After Visit Summary for patient specific instructions.  Future Appointments  Date Time Provider Department Center  12/27/2021  9:30 AM Sheliah Hatchabori, Katherine E, MD [facility]    No orders of the defined types were placed in this encounter.      -------------------------------

## 2021-09-02 ENCOUNTER — Other Ambulatory Visit: Payer: Self-pay | Admitting: Registered Nurse

## 2021-09-02 DIAGNOSIS — E039 Hypothyroidism, unspecified: Secondary | ICD-10-CM

## 2021-10-10 ENCOUNTER — Telehealth: Payer: Self-pay | Admitting: Psychiatry

## 2021-10-10 NOTE — Telephone Encounter (Signed)
Is this possibly a PA problem?  She's taking it for binge eating disorder and Vyvanse is the only FDA approved med for that ?

## 2021-10-10 NOTE — Telephone Encounter (Signed)
Correct just waiting for her PA to come back. Its been approved before ?

## 2021-10-10 NOTE — Telephone Encounter (Signed)
Katherine [name] called and LM this morning at 10:30am to report that insurance is not covering her Vyvanse.  She would like to switch to Adderall. She thinks she tried it before and would like to give it a try again.  Please call to discuss. ?

## 2021-10-11 NOTE — Telephone Encounter (Signed)
Prior Approval received for VYVANSE 60 MG effective 10/11/2021-10/12/2022 with [employer] ?

## 2021-10-12 NOTE — Telephone Encounter (Signed)
LVM with info

## 2021-11-12 ENCOUNTER — Telehealth: Payer: Self-pay | Admitting: Psychiatry

## 2021-11-12 ENCOUNTER — Other Ambulatory Visit: Payer: Self-pay | Admitting: Psychiatry

## 2021-11-12 DIAGNOSIS — F5081 Binge eating disorder: Secondary | ICD-10-CM

## 2021-11-12 MED ORDER — LISDEXAMFETAMINE DIMESYLATE 50 MG PO CAPS: 50.0000 mg | ORAL_CAPSULE | Freq: Every day | ORAL | 0 refills | Status: DC

## 2021-11-12 MED ORDER — LISDEXAMFETAMINE DIMESYLATE 60 MG PO CAPS: 60.0000 mg | ORAL_CAPSULE | ORAL | 0 refills | Status: DC

## 2021-11-12 NOTE — Telephone Encounter (Signed)
Pt requesting Rx Vyvanse 60 mg [employer]. Apt 7/12

## 2021-12-27 ENCOUNTER — Ambulatory Visit: Payer: Managed Care, Other (non HMO) | Admitting: Family Medicine

## 2022-01-02 ENCOUNTER — Ambulatory Visit (INDEPENDENT_AMBULATORY_CARE_PROVIDER_SITE_OTHER): Payer: 59 | Admitting: Psychiatry

## 2022-01-02 ENCOUNTER — Encounter: Payer: Self-pay | Admitting: Psychiatry

## 2022-01-02 VITALS — BP 115/69 | HR 66

## 2022-01-02 DIAGNOSIS — F5081 Binge eating disorder: Secondary | ICD-10-CM | POA: Diagnosis not present

## 2022-01-02 DIAGNOSIS — R69 Illness, unspecified: Secondary | ICD-10-CM | POA: Diagnosis not present

## 2022-01-02 DIAGNOSIS — F5105 Insomnia due to other mental disorder: Secondary | ICD-10-CM | POA: Diagnosis not present

## 2022-01-02 DIAGNOSIS — F411 Generalized anxiety disorder: Secondary | ICD-10-CM | POA: Diagnosis not present

## 2022-01-02 MED ORDER — LISDEXAMFETAMINE DIMESYLATE 60 MG PO CAPS: 60.0000 mg | ORAL_CAPSULE | ORAL | 0 refills | Status: DC

## 2022-01-02 NOTE — Progress Notes (Signed)
Katherine [name] [id number] 05/11/1976 46 y.o.  Subjective:   [name] is a 46 y.o. (DOB [birth year]) female.  Chief Complaint:  Chief Complaint  Patient presents with   Follow-up   Anxiety    Anxiety Patient reports no confusion, decreased concentration, dizziness, nervous/anxious behavior, palpitations or suicidal ideas.    Depression        Associated symptoms include no decreased concentration and no suicidal ideas.  Past medical history includes anxiety.    Katherine [name] presents to the office today for follow-up of  EDO and mood. And stress.  seen in January 2021.  No meds were changed.  01/19/2020 appointment with the following noted: Mood has been OK.  Dog with cancer. Normal sadness. Tolerating meds except lower dose Topomax DT word-finding problems.  less struggles with some depression chronically.  Managing OK.  Can't go to gym which helped her mood.  Handling better than she did.   Sleep better now without meds. No concerns about the meds.  Vyvanse helps but wonders if she' more tolerant to it.  Can't take more topomax without cog problems.  Still binge eats.  Generally not depressed.  Function is okay.  Still gets anxious and stressed but has triggers for that.  Tolerates meds.  No change in binge eating pattern. Sleep affected by stress and don't want sleep meds. No new meds.  07/17/2020 appointment with following noted: Covid December through holidays.  Ed hospitalized briefly and recovered. Doing OK.  Louis at [employer] and going OK so far. Most of the time feels good.  Big transition having Louis come home and everyone sick. EDO not the best with holidays and stress and change in routine.  Would be great if there was something that could make it go away. Plan no changes  01/02/21 appt noted: About the same.  No local studies available for her to particpate in. No SE. Unless cog SE with topiramate over 50 mg daily. Patient reports stable mood  and denies depressed or irritable moods.  Patient denies any recent difficulty with anxiety.  Patient denies difficulty with sleep initiation or maintenance. Denies appetite disturbance.  Patient reports that energy and motivation have been good.  Patient denies any difficulty with concentration.  Patient denies any suicidal ideation. Still dealing with EDO. Louis is better but still loses control at times in public and at home.  His school went well. Plan: Disc potential off label naltrexone 25 mg - 50 mg q PM.  Disc SE and she agrees  07/05/2021 appointment with the following noted: Forgot naltrexone too much so not taking it. Still Vyvanse 60 and sertraline 200 mg daily. Has been fine over all with mood and anxiety. Still dealing with EDO issues. No Se with meds. Patient reports stable mood and denies depressed or irritable moods.  Patient denies any recent difficulty with anxiety.  Patient denies difficulty with sleep initiation or maintenance. Denies appetite disturbance.  Patient reports that energy and motivation have been good.  Patient denies any difficulty with concentration.  Patient denies any suicidal ideation. Still dealing with thyroid problems. Plan: Disc potential off label naltrexone 25 mg - 50 mg q PM.  Disc SE and she agrees  01/02/22 appt noted: OK overall but things at house are getting rough again. She doesn't think things are better but Ed diminishes that he does it. Louis can't regulate food intake won't stop eating until he throws up.  Needs antipsychotic.  Can't self regulate. Outside of the house Lissa Hoard is OK.  He needs constant stimulation.  He paces and can't settle down. Tried naltrexone a couple of times but not consistent with it.  No effects or SE noted.   She feels overall her meds ok.  She is not happy. Louis going to [employer] school in August and expects to make things better at home.   Past Psychiatric Medication Trials: NAC capsules heartburn,  topiramate  100 Cog SE, sertraline 200,  naltrexone, Vyvanse  Review of Systems:  Review of Systems  Cardiovascular:  Negative for palpitations.  Gastrointestinal:  Negative for constipation.  Neurological:  Negative for dizziness and tremors.  Psychiatric/Behavioral:  Positive for dysphoric mood. Negative for agitation, behavioral problems, confusion, decreased concentration, hallucinations, self-injury, sleep disturbance and suicidal ideas. The patient is not nervous/anxious and is not hyperactive.     Medications: I have reviewed the patient's current medications.  Current Outpatient Medications  Medication Sig Dispense Refill   betamethasone dipropionate (DIPROLENE) 0.05 % ointment Apply topically 2 (two) times daily. 50 g 1   BIOTIN PO Take 1 tablet by mouth daily.      CALCIUM PO Take 1 tablet by mouth daily.     cholecalciferol (VITAMIN D3) 25 MCG (1000 UT) tablet Take 1,000 Units by mouth daily.     levothyroxine (SYNTHROID) 137 MCG tablet TAKE 1 TABLET BY MOUTH BEFORE BREAKFAST 90 tablet 0   loratadine (CLARITIN) 10 MG tablet Take 10 mg by mouth daily.     rizatriptan (MAXALT) 10 MG tablet Take 1 tablet (10 mg total) by mouth daily as needed. May repeat in 2 hours if needed 10 tablet 2   rosuvastatin (CRESTOR) 10 MG tablet Take 1 tablet (10 mg total) by mouth daily. 90 tablet 3   sertraline (ZOLOFT) 100 MG tablet Take 2 tablets (200 mg total) by mouth daily. 180 tablet 1   UBRELVY 100 MG TABS      valACYclovir (VALTREX) 500 MG tablet Take 1 tablet (500 mg total) by mouth 2 (two) times daily as needed (outbreak). 60 tablet 1   [START ON 01/30/2022] lisdexamfetamine (VYVANSE) 60 MG capsule Take 1 capsule (60 mg total) by mouth every morning. 30 capsule 0   lisdexamfetamine (VYVANSE) 60 MG capsule Take 1 capsule (60 mg total) by mouth every morning. 30 capsule 0   No current facility-administered medications for this visit.    Medication Side Effects: None  Allergies: Not on File  Past  Medical History:  Diagnosis Date   Migraine    Unspecified hypothyroidism     Family History  Problem Relation Age of Onset   Alcohol abuse Mother    Cancer Mother        lung   Heart disease Mother    Hypertension Mother    Hyperlipidemia Father    Heart disease Father    Hypertension Father    Diabetes Father    Cancer Other    Hypertension Other    Hyperlipidemia Other    Heart attack Other    Stroke Other     Social History   Socioeconomic History   Marital status: Married    Spouse name: Not on file   Number of children: 1   Years of education: Not on file   Highest education level: Not on file  Occupational History    Employer: UNEMPLOYED  Tobacco Use   Smoking status: Former   Smokeless tobacco: Never  Building services engineer Use: Never  used  Substance and Sexual Activity   Alcohol use: Yes    Comment: occasionally wine   Drug use: No   Sexual activity: Not on file  Other Topics Concern   Not on file  Social History Narrative   Not on file   Social Determinants of Health   Financial Resource Strain: Not on file  Food Insecurity: Not on file  Transportation Needs: Not on file  Physical Activity: Not on file  Stress: Not on file  Social Connections: Not on file  Intimate Partner Violence: Not on file    Past Medical History, Surgical history, Social history, and Family history were reviewed and updated as appropriate.   Please see review of systems for further details on the patient's review from today.   Objective:   Physical Exam:  BP 115/69   Pulse 66   Physical Exam Constitutional:      General: She is not in acute distress.    Appearance: She is well-developed.  Musculoskeletal:        General: No deformity.  Neurological:     Mental Status: She is alert and oriented to person, place, and time.     Motor: No tremor.     Coordination: Coordination normal.     Gait: Gait normal.  Psychiatric:        Attention and Perception:  Attention and perception normal.        Mood and Affect: Mood is depressed. Mood is not anxious. Affect is not labile, blunt, angry or inappropriate.        Speech: Speech normal.        Behavior: Behavior normal.        Thought Content: Thought content normal. Thought content is not delusional. Thought content does not include homicidal or suicidal ideation. Thought content does not include suicidal plan.        Cognition and Memory: Cognition normal.        Judgment: Judgment normal.     Comments: Insight intact. No auditory or visual hallucinations. No delusions.  Anxiety manageable. Some depression is present    Lou  Lab Review:     Component Value Date/Time   NA 139 06/28/2021 1427   K 4.3 06/28/2021 1427   CL 103 06/28/2021 1427   CO2 30 06/28/2021 1427   GLUCOSE 85 06/28/2021 1427   BUN 15 06/28/2021 1427   CREATININE 0.86 06/28/2021 1427   CALCIUM 9.2 06/28/2021 1427   PROT 6.4 06/28/2021 1427   ALBUMIN 4.1 06/28/2021 1427   AST 15 06/28/2021 1427   ALT 12 06/28/2021 1427   ALKPHOS 45 06/28/2021 1427   BILITOT 0.3 06/28/2021 1427   GFRNONAA >60 01/10/2015 1910   GFRAA >60 01/10/2015 1910       Component Value Date/Time   WBC 5.0 06/28/2021 1427   RBC 4.43 06/28/2021 1427   HGB 13.4 06/28/2021 1427   HCT 40.3 06/28/2021 1427   PLT 247.0 06/28/2021 1427   MCV 91.0 06/28/2021 1427   MCH 30.8 01/10/2015 1910   MCHC 33.1 06/28/2021 1427   RDW 12.8 06/28/2021 1427   LYMPHSABS 1.5 06/28/2021 1427   MONOABS 0.3 06/28/2021 1427   EOSABS 0.1 06/28/2021 1427   BASOSABS 0.0 06/28/2021 1427    No results found for: "POCLITH", "LITHIUM"   No results found for: "PHENYTOIN", "PHENOBARB", "VALPROATE", "CBMZ"   .res Assessment: Plan:    Katherine [name] was seen today for follow-up and anxiety.  Diagnoses and all orders for this visit:  Binge eating disorder -     lisdexamfetamine (VYVANSE) 60 MG capsule; Take 1 capsule (60 mg total) by mouth every morning. -      lisdexamfetamine (VYVANSE) 60 MG capsule; Take 1 capsule (60 mg total) by mouth every morning.  Generalized anxiety disorder  Insomnia due to mental condition   Supportive therapy dealing with son disc in detail.  Disc him getting care.    Unlikely other meds for EDO would help.  The 2 best are Vyvanse and topiramate.  She had SE with topiramate.  No indication for med change except at noted. Vyvanse does help her with the binge eating but it does not solve the problem.  She has had counseling.  She alternates between 60 and 70 mg.  At times to 70 mg seems to be a little too high making her feel jittery.  The other reason she alternates is to reduce the risk of tolerance. Disc pending generics.    Anxiety is generally manageable with the sertraline it clearly helps. More sad than anxious. Consider duloxetine switch at FU.  Today not time for change.  FU 6 mos   Meredith Staggers, MD, DFAPA  Please see After Visit Summary for patient specific instructions.  Future Appointments  Date Time Provider Department Center  01/21/2022  9:20 AM Sheliah Hatch, MD [facility]    No orders of the defined types were placed in this encounter.      -------------------------------

## 2022-01-21 ENCOUNTER — Encounter: Payer: Self-pay | Admitting: Family Medicine

## 2022-01-21 ENCOUNTER — Ambulatory Visit (INDEPENDENT_AMBULATORY_CARE_PROVIDER_SITE_OTHER): Payer: 59 | Admitting: Family Medicine

## 2022-01-21 VITALS — BP 118/78 | HR 65 | Temp 98.1°F | Resp 16 | Ht 64.0 in | Wt 131.4 lb

## 2022-01-21 DIAGNOSIS — E7800 Pure hypercholesterolemia, unspecified: Secondary | ICD-10-CM | POA: Diagnosis not present

## 2022-01-21 DIAGNOSIS — E039 Hypothyroidism, unspecified: Secondary | ICD-10-CM

## 2022-01-21 LAB — CBC WITH DIFFERENTIAL/PLATELET
Basophils Absolute: 0 10*3/uL (ref 0.0–0.1)
Basophils Relative: 1 % (ref 0.0–3.0)
Eosinophils Absolute: 0.2 10*3/uL (ref 0.0–0.7)
Eosinophils Relative: 4.8 % (ref 0.0–5.0)
HCT: 42.2 % (ref 36.0–46.0)
Hemoglobin: 13.9 g/dL (ref 12.0–15.0)
Lymphocytes Relative: 36.1 % (ref 12.0–46.0)
Lymphs Abs: 1.4 10*3/uL (ref 0.7–4.0)
MCHC: 33 g/dL (ref 30.0–36.0)
MCV: 90 fl (ref 78.0–100.0)
Monocytes Absolute: 0.3 10*3/uL (ref 0.1–1.0)
Monocytes Relative: 7.6 % (ref 3.0–12.0)
Neutro Abs: 2 10*3/uL (ref 1.4–7.7)
Neutrophils Relative %: 50.5 % (ref 43.0–77.0)
Platelets: 213 10*3/uL (ref 150.0–400.0)
RBC: 4.69 Mil/uL (ref 3.87–5.11)
RDW: 12.9 % (ref 11.5–15.5)
WBC: 4 10*3/uL (ref 4.0–10.5)

## 2022-01-21 LAB — TSH: TSH: 20.41 u[IU]/mL — ABNORMAL HIGH (ref 0.35–5.50)

## 2022-01-21 NOTE — Progress Notes (Signed)
   Subjective:    Patient ID: Katherine [name], female    DOB: 1975-07-04, 46 y.o.   MRN: [id number]  HPI Hypothyroid- chronic problem, on Levothyroxine daily.  Pt reports suddenly having hot flashes, not sleeping well, hair is falling out.    Hyperlipidemia- chronic problem, on Crestor 10mg  daily.  Denies CP, abd pain, N/V.   Review of Systems For ROS see HPI     Objective:   Physical Exam Vitals reviewed.  Constitutional:      General: She is not in acute distress.    Appearance: Normal appearance. She is well-developed. She is not ill-appearing.  HENT:     Head: Normocephalic and atraumatic.  Eyes:     Conjunctiva/sclera: Conjunctivae normal.     Pupils: Pupils are equal, round, and reactive to light.  Neck:     Thyroid: No thyromegaly.  Cardiovascular:     Rate and Rhythm: Normal rate and regular rhythm.     Heart sounds: Normal heart sounds. No murmur heard. Pulmonary:     Effort: Pulmonary effort is normal. No respiratory distress.     Breath sounds: Normal breath sounds.  Abdominal:     General: There is no distension.     Palpations: Abdomen is soft.     Tenderness: There is no abdominal tenderness.  Musculoskeletal:     Cervical back: Normal range of motion and neck supple.  Lymphadenopathy:     Cervical: No cervical adenopathy.  Skin:    General: Skin is warm and dry.  Neurological:     General: No focal deficit present.     Mental Status: She is alert and oriented to person, place, and time.  Psychiatric:        Mood and Affect: Mood normal.        Behavior: Behavior normal.           Assessment & Plan:

## 2022-01-21 NOTE — Assessment & Plan Note (Signed)
Chronic problem.  Tolerating Crestor 10mg daily w/o difficulty.  Check labs.  Adjust meds prn  

## 2022-01-21 NOTE — Assessment & Plan Note (Signed)
Chronic problem.  Pt reports hot flashes, not sleeping well, and hair loss.  She suspects her thyroid is 'off'.  Check labs.  Adjust meds prn

## 2022-01-21 NOTE — Patient Instructions (Addendum)
Schedule your complete physical in 6 months  We'll notify you of your lab results and make any changes if needed Keep up the good work on healthy diet and regular exercise- you look great! Call with any questions or concerns Stay Safe!  Stay Healthy! GOOD LUCK WITH MOVE IN!!

## 2022-01-22 ENCOUNTER — Telehealth: Payer: Self-pay

## 2022-01-22 ENCOUNTER — Other Ambulatory Visit: Payer: Self-pay

## 2022-01-22 DIAGNOSIS — E039 Hypothyroidism, unspecified: Secondary | ICD-10-CM

## 2022-01-22 LAB — BASIC METABOLIC PANEL
BUN: 13 mg/dL (ref 6–23)
CO2: 30 mEq/L (ref 19–32)
Calcium: 9.2 mg/dL (ref 8.4–10.5)
Chloride: 102 mEq/L (ref 96–112)
Creatinine, Ser: 0.95 mg/dL (ref 0.40–1.20)
GFR: 70.82 mL/min (ref >60.00)
Glucose, Bld: 81 mg/dL (ref 70–99)
Potassium: 4.3 mEq/L (ref 3.5–5.1)
Sodium: 140 mEq/L (ref 135–145)

## 2022-01-22 LAB — LIPID PANEL
Cholesterol: 227 mg/dL — ABNORMAL HIGH (ref 0–200)
HDL: 108 mg/dL (ref >39.00)
LDL Cholesterol: 107 mg/dL — ABNORMAL HIGH (ref 0–99)
NonHDL: 118.53
Total CHOL/HDL Ratio: 2
Triglycerides: 58 mg/dL (ref 0.0–149.0)
VLDL: 11.6 mg/dL (ref 0.0–40.0)

## 2022-01-22 LAB — HEPATIC FUNCTION PANEL
ALT: 15 U/L (ref 0–35)
AST: 23 U/L (ref 0–37)
Albumin: 4.4 g/dL (ref 3.5–5.2)
Alkaline Phosphatase: 43 U/L (ref 39–117)
Bilirubin, Direct: 0.1 mg/dL (ref 0.0–0.3)
Total Bilirubin: 0.6 mg/dL (ref 0.2–1.2)
Total Protein: 6.9 g/dL (ref 6.0–8.3)

## 2022-01-22 MED ORDER — LEVOTHYROXINE SODIUM 150 MCG PO TABS: 150.0000 ug | ORAL_TABLET | Freq: Every day | ORAL | 3 refills | Status: DC

## 2022-01-22 NOTE — Telephone Encounter (Signed)
Informed pt of lab results . Repeat TSH order is in and apt made . Rx for Levothyroxine has been sent to pharmacy

## 2022-01-22 NOTE — Telephone Encounter (Signed)
-----   Message from Sheliah Hatch, MD sent at 01/22/2022  7:24 AM EDT ----- You were absolutely correct when you said your thyroid is off!  We will increase your Levothyroxine to daily (please take 30 minutes before eating or taking other meds) and repeat your TSH level at a lab only visit in 1 month  Remainder of labs look great!

## 2022-02-21 ENCOUNTER — Telehealth: Payer: Self-pay | Admitting: Psychiatry

## 2022-02-21 ENCOUNTER — Other Ambulatory Visit: Payer: Self-pay

## 2022-02-21 ENCOUNTER — Other Ambulatory Visit (INDEPENDENT_AMBULATORY_CARE_PROVIDER_SITE_OTHER): Payer: 59

## 2022-02-21 DIAGNOSIS — E039 Hypothyroidism, unspecified: Secondary | ICD-10-CM

## 2022-02-21 LAB — TSH: TSH: 0.22 u[IU]/mL — ABNORMAL LOW (ref 0.35–5.50)

## 2022-02-21 NOTE — Telephone Encounter (Signed)
We already did a PA in January and it was approved.She can't use the voucher any more and can't afford the cost of med.Please advise

## 2022-02-21 NOTE — Telephone Encounter (Signed)
Vyvanse generic was approved by the FDA 2 days ago.  There are 12 companies making the generic.  Supplies should be arriving in the pharmacy within a few days would be my expectation.  There is not a good generic alternative.  The closest option would be to take regular Adderall 2 or 3 times a day.  So I can prescribe that or she can contact the pharmacy and see when they think they will be receiving the generic Vyvanse.

## 2022-02-21 NOTE — Telephone Encounter (Signed)
Katherine [name] called this morning at 10:30 to report the the Vyvanse is too expensive and she can't use the coupon anymore.  She would like to know if there is an alternate medication.  Please call to discuss. Apt 9/28

## 2022-02-21 NOTE — Progress Notes (Signed)
Informed pt of lab results . Repeat lab apt has been made and order is in place

## 2022-02-22 ENCOUNTER — Other Ambulatory Visit: Payer: Self-pay | Admitting: Psychiatry

## 2022-02-22 DIAGNOSIS — F900 Attention-deficit hyperactivity disorder, predominantly inattentive type: Secondary | ICD-10-CM

## 2022-02-22 MED ORDER — AMPHETAMINE-DEXTROAMPHET ER 25 MG PO CP24: 25.0000 mg | ORAL_CAPSULE | Freq: Every day | ORAL | 0 refills | Status: DC

## 2022-02-22 NOTE — Telephone Encounter (Signed)
I sent it.  Tell her because it will not last as long as evolving she is likely to need to take it around mid day.  She wants it to help with binge eating in the evening. The associated diagnosis for prior authorization purposes is not binge eating disorder but ADD HD and that is noted on the chart

## 2022-02-22 NOTE — Telephone Encounter (Signed)
LVM to rtc 

## 2022-02-22 NOTE — Telephone Encounter (Signed)
Pt stated she would like to try adderall XR

## 2022-03-20 ENCOUNTER — Other Ambulatory Visit: Payer: 59

## 2022-03-21 ENCOUNTER — Ambulatory Visit: Payer: 59 | Admitting: Psychiatry

## 2022-03-27 ENCOUNTER — Telehealth: Payer: Self-pay

## 2022-03-27 ENCOUNTER — Other Ambulatory Visit (INDEPENDENT_AMBULATORY_CARE_PROVIDER_SITE_OTHER): Payer: 59

## 2022-03-27 DIAGNOSIS — E039 Hypothyroidism, unspecified: Secondary | ICD-10-CM

## 2022-03-27 LAB — TSH: TSH: 0.25 u[IU]/mL — ABNORMAL LOW (ref 0.35–5.50)

## 2022-03-27 NOTE — Telephone Encounter (Signed)
Informed pt  Dr Birdie Riddle message and she states that sounds great and would let us know

## 2022-03-27 NOTE — Telephone Encounter (Signed)
Spoke to patient and advised her of her labs. She voiced understanding and asked what does "monitor" mean. I let her know that according to Dr Virgil Benedict note, she would like to see her in 6 months and it would be checked then.   Dr Birdie Riddle, would you like it checked before her next appt in 6 months?

## 2022-03-27 NOTE — Telephone Encounter (Signed)
I believe 6 months would be appropriate but if pt has concerns or would like this checked sooner (either based on time elapsed or symptoms) we can certainly make that happen

## 2022-03-27 NOTE — Telephone Encounter (Signed)
-----   Message from Midge Minium, MD sent at 03/27/2022  3:32 PM EDT ----- Since your thyroid is so labile, and today's value is only 1/10 below normal range, we're going to just leave things as they are for now and continue to monitor.

## 2022-05-11 ENCOUNTER — Other Ambulatory Visit: Payer: Self-pay | Admitting: Psychiatry

## 2022-05-11 DIAGNOSIS — F411 Generalized anxiety disorder: Secondary | ICD-10-CM

## 2022-05-11 DIAGNOSIS — F5081 Binge eating disorder: Secondary | ICD-10-CM

## 2022-05-13 ENCOUNTER — Other Ambulatory Visit: Payer: Self-pay

## 2022-05-13 DIAGNOSIS — E782 Mixed hyperlipidemia: Secondary | ICD-10-CM

## 2022-05-13 MED ORDER — ROSUVASTATIN CALCIUM 10 MG PO TABS: 10.0000 mg | ORAL_TABLET | Freq: Every day | ORAL | 1 refills | Status: DC

## 2022-05-20 ENCOUNTER — Telehealth: Payer: Self-pay

## 2022-05-20 ENCOUNTER — Telehealth: Payer: Self-pay | Admitting: Psychiatry

## 2022-05-20 DIAGNOSIS — F5081 Binge eating disorder: Secondary | ICD-10-CM

## 2022-05-20 MED ORDER — LISDEXAMFETAMINE DIMESYLATE 60 MG PO CAPS: 60.0000 mg | ORAL_CAPSULE | ORAL | 0 refills | Status: DC

## 2022-05-20 NOTE — Telephone Encounter (Signed)
Pt called at 11:03a.  She would like refill of Vyvanse sent to [facility]   Next appt 12/18

## 2022-05-20 NOTE — Telephone Encounter (Signed)
Pended.

## 2022-05-30 ENCOUNTER — Other Ambulatory Visit: Payer: Self-pay

## 2022-05-30 DIAGNOSIS — F5081 Binge eating disorder: Secondary | ICD-10-CM

## 2022-05-30 MED ORDER — LISDEXAMFETAMINE DIMESYLATE 60 MG PO CAPS: 60.0000 mg | ORAL_CAPSULE | ORAL | 0 refills | Status: DC

## 2022-05-30 NOTE — Telephone Encounter (Signed)
Pended.

## 2022-05-30 NOTE — Telephone Encounter (Signed)
Pt called and said that the generic vyvanse should have been called into [facility] and cancelled at [employer]

## 2022-06-10 ENCOUNTER — Ambulatory Visit: Payer: 59 | Admitting: Psychiatry

## 2022-06-10 ENCOUNTER — Encounter: Payer: Self-pay | Admitting: Psychiatry

## 2022-06-10 VITALS — BP 114/76 | HR 67

## 2022-06-10 DIAGNOSIS — F5105 Insomnia due to other mental disorder: Secondary | ICD-10-CM

## 2022-06-10 DIAGNOSIS — F900 Attention-deficit hyperactivity disorder, predominantly inattentive type: Secondary | ICD-10-CM | POA: Diagnosis not present

## 2022-06-10 DIAGNOSIS — F5081 Binge eating disorder: Secondary | ICD-10-CM | POA: Diagnosis not present

## 2022-06-10 DIAGNOSIS — F411 Generalized anxiety disorder: Secondary | ICD-10-CM

## 2022-06-10 DIAGNOSIS — R69 Illness, unspecified: Secondary | ICD-10-CM | POA: Diagnosis not present

## 2022-06-10 MED ORDER — LISDEXAMFETAMINE DIMESYLATE 40 MG PO CAPS: ORAL_CAPSULE | ORAL | 0 refills | Status: DC

## 2022-06-10 NOTE — Progress Notes (Signed)
Katherine [name] [id number] 07-09-1975 46 y.o.  Subjective:   [name] is a 46 y.o. (DOB 01-Oct-1975) female.  Chief Complaint:  Chief Complaint  Patient presents with   Follow-up   Anxiety   ADHD    Anxiety Patient reports no confusion, decreased concentration, nervous/anxious behavior, palpitations or suicidal ideas.    Depression        Associated symptoms include no decreased concentration and no suicidal ideas.  Past medical history includes anxiety.    [name] presents to the office today for follow-up of  EDO and mood. And stress.  seen in January 2021.  No meds were changed.  01/19/2020 appointment with the following noted: Mood has been OK.  Dog with cancer. Normal sadness. Tolerating meds except lower dose Topomax DT word-finding problems.  less struggles with some depression chronically.  Managing OK.  Can't go to gym which helped her mood.  Handling better than she did.   Sleep better now without meds. No concerns about the meds.  Vyvanse helps but wonders if she' more tolerant to it.  Can't take more topomax without cog problems.  Still binge eats.  Generally not depressed.  Function is okay.  Still gets anxious and stressed but has triggers for that.  Tolerates meds.  No change in binge eating pattern. Sleep affected by stress and don't want sleep meds. No new meds.  07/17/2020 appointment with following noted: Covid December through holidays.  Ed hospitalized briefly and recovered. Doing OK.  Louis at [employer] and going OK so far. Most of the time feels good.  Big transition having Louis come home and everyone sick. EDO not the best with holidays and stress and change in routine.  Would be great if there was something that could make it go away. Plan no changes  01/02/21 appt noted: About the same.  No local studies available for her to particpate in. No SE. Unless cog SE with topiramate over 50 mg daily. Patient reports stable mood and  denies depressed or irritable moods.  Patient denies any recent difficulty with anxiety.  Patient denies difficulty with sleep initiation or maintenance. Denies appetite disturbance.  Patient reports that energy and motivation have been good.  Patient denies any difficulty with concentration.  Patient denies any suicidal ideation. Still dealing with EDO. Louis is better but still loses control at times in public and at home.  His school went well. Plan: Disc potential off label naltrexone 25 mg - 50 mg q PM.  Disc SE and she agrees  07/05/2021 appointment with the following noted: Forgot naltrexone too much so not taking it. Still Vyvanse 60 and sertraline 200 mg daily. Has been fine over all with mood and anxiety. Still dealing with EDO issues. No Se with meds. Patient reports stable mood and denies depressed or irritable moods.  Patient denies any recent difficulty with anxiety.  Patient denies difficulty with sleep initiation or maintenance. Denies appetite disturbance.  Patient reports that energy and motivation have been good.  Patient denies any difficulty with concentration.  Patient denies any suicidal ideation. Still dealing with thyroid problems. Plan: Disc potential off label naltrexone 25 mg - 50 mg q PM.  Disc SE and she agrees  01/02/22 appt noted: OK overall but things at house are getting rough again. She doesn't think things are better but Ed diminishes that he does it. Louis can't regulate food intake won't stop eating until he throws up.  Needs  antipsychotic.  Can't self regulate. Outside of the house Lissa Hoard is OK.  He needs constant stimulation.  He paces and can't settle down. Tried naltrexone a couple of times but not consistent with it.  No effects or SE noted.   She feels overall her meds ok.  She is not happy. Louis going to [employer] school in August and expects to make things better at home.  06/10/22 appt noted: Doing OK overall.  No major changes.  Not purging as  much but still binging and mostly at night 7pm on. Taking Vyvanse about 10 AM.  No trouble falling asleep. No night eating.  Gets 8 hours of sleep.   If forgets Vyvanse then has binging purging day.  Would like to see this get better. No sig depression or anxiety. No SE  Past Psychiatric Medication Trials: NAC capsules heartburn,  topiramate 100 Cog SE, sertraline 200,  naltrexone, Vyvanse  Review of Systems:  Review of Systems  Cardiovascular:  Negative for palpitations.  Gastrointestinal:  Negative for constipation.  Neurological:  Negative for tremors.  Psychiatric/Behavioral:  Positive for dysphoric mood. Negative for agitation, behavioral problems, confusion, decreased concentration, hallucinations, self-injury, sleep disturbance and suicidal ideas. The patient is not nervous/anxious and is not hyperactive.     Medications: I have reviewed the patient's current medications.  Current Outpatient Medications  Medication Sig Dispense Refill   levothyroxine (SYNTHROID) 137 MCG tablet TAKE 1 TABLET BY MOUTH BEFORE BREAKFAST 90 tablet 0   levothyroxine (SYNTHROID) 150 MCG tablet Take 1 tablet (150 mcg total) by mouth daily. 90 tablet 3   loratadine (CLARITIN) 10 MG tablet Take 10 mg by mouth daily.     rizatriptan (MAXALT) 10 MG tablet Take 1 tablet (10 mg total) by mouth daily as needed. May repeat in 2 hours if needed 10 tablet 2   rosuvastatin (CRESTOR) 10 MG tablet Take 1 tablet (10 mg total) by mouth daily. 90 tablet 1   sertraline (ZOLOFT) 100 MG tablet Take 2 tablets by mouth once daily 180 tablet 0   valACYclovir (VALTREX) 500 MG tablet Take 1 tablet (500 mg total) by mouth 2 (two) times daily as needed (outbreak). 60 tablet 1   amphetamine-dextroamphetamine (ADDERALL XR) 25 MG 24 hr capsule Take 1 capsule by mouth daily at 12 noon. (Patient not taking: Reported on 06/10/2022) 30 capsule 0   lisdexamfetamine (VYVANSE) 40 MG capsule 1 capsule in the AM and 1 capsule at 2 PM 60  capsule 0   No current facility-administered medications for this visit.    Medication Side Effects: None  Allergies: Not on File  Past Medical History:  Diagnosis Date   Migraine    Unspecified hypothyroidism     Family History  Problem Relation Age of Onset   Alcohol abuse Mother    Cancer Mother        lung   Heart disease Mother    Hypertension Mother    Hyperlipidemia Father    Heart disease Father    Hypertension Father    Diabetes Father    Cancer Other    Hypertension Other    Hyperlipidemia Other    Heart attack Other    Stroke Other     Social History   Socioeconomic History   Marital status: Married    Spouse name: Not on file   Number of children: 1   Years of education: Not on file   Highest education level: Not on file  Occupational History    Employer:  UNEMPLOYED  Tobacco Use   Smoking status: Former   Smokeless tobacco: Never  Building services engineer Use: Never used  Substance and Sexual Activity   Alcohol use: Yes    Comment: occasionally wine   Drug use: No   Sexual activity: Not on file  Other Topics Concern   Not on file  Social History Narrative   Not on file   Social Determinants of Health   Financial Resource Strain: Not on file  Food Insecurity: Not on file  Transportation Needs: Not on file  Physical Activity: Not on file  Stress: Not on file  Social Connections: Not on file  Intimate Partner Violence: Not on file    Past Medical History, Surgical history, Social history, and Family history were reviewed and updated as appropriate.   Please see review of systems for further details on the patient's review from today.   Objective:   Physical Exam:  BP 114/76   Pulse 67   Physical Exam Constitutional:      General: She is not in acute distress.    Appearance: She is well-developed.  Musculoskeletal:        General: No deformity.  Neurological:     Mental Status: She is alert and oriented to person, place, and  time.     Motor: No tremor.     Coordination: Coordination normal.     Gait: Gait normal.  Psychiatric:        Attention and Perception: Attention and perception normal.        Mood and Affect: Mood is depressed. Mood is not anxious. Affect is not labile, blunt or angry.        Speech: Speech normal.        Behavior: Behavior normal.        Thought Content: Thought content normal. Thought content is not delusional. Thought content does not include homicidal or suicidal ideation. Thought content does not include suicidal plan.        Cognition and Memory: Cognition normal.        Judgment: Judgment normal.     Comments: Insight intact. No auditory or visual hallucinations. No delusions.  Anxiety manageable. Some depression is present      Lab Review:     Component Value Date/Time   NA 140 01/21/2022 1000   K 4.3 01/21/2022 1000   CL 102 01/21/2022 1000   CO2 30 01/21/2022 1000   GLUCOSE 81 01/21/2022 1000   BUN 13 01/21/2022 1000   CREATININE 0.95 01/21/2022 1000   CALCIUM 9.2 01/21/2022 1000   PROT 6.9 01/21/2022 1000   ALBUMIN 4.4 01/21/2022 1000   AST 23 01/21/2022 1000   ALT 15 01/21/2022 1000   ALKPHOS 43 01/21/2022 1000   BILITOT 0.6 01/21/2022 1000   GFRNONAA >60 01/10/2015 1910   GFRAA >60 01/10/2015 1910       Component Value Date/Time   WBC 4.0 01/21/2022 1000   RBC 4.69 01/21/2022 1000   HGB 13.9 01/21/2022 1000   HCT 42.2 01/21/2022 1000   PLT 213.0 01/21/2022 1000   MCV 90.0 01/21/2022 1000   MCH 30.8 01/10/2015 1910   MCHC 33.0 01/21/2022 1000   RDW 12.9 01/21/2022 1000   LYMPHSABS 1.4 01/21/2022 1000   MONOABS 0.3 01/21/2022 1000   EOSABS 0.2 01/21/2022 1000   BASOSABS 0.0 01/21/2022 1000    No results found for: "POCLITH", "LITHIUM"   No results found for: "PHENYTOIN", "PHENOBARB", "VALPROATE", "CBMZ"   .res Assessment:  Plan:    Barth Kirkseri was seen today for follow-up, anxiety and adhd.  Diagnoses and all orders for this  visit:  Generalized anxiety disorder  Binge eating disorder -     lisdexamfetamine (VYVANSE) 40 MG capsule; 1 capsule in the AM and 1 capsule at 2 PM  Attention deficit hyperactivity disorder (ADHD), predominantly inattentive type  Insomnia due to mental condition   Supportive therapy dealing with son disc in detail.  Disc him getting care.    Unlikely other meds for EDO would help.  The 2 best are Vyvanse and topiramate.  She had SE with topiramate.  Vyvanse does help her with the binge eating  and purging until the evening and then it gets worse.  Overall over the years less purging but still binging.    She has had counseling.   Disc pending generics.   Change Vyvanse to BID BC it's not helping binge eating in the evening. Need to extend duration.  Daily binging in the evening 40 mg BID  Anxiety is generally manageable with the sertraline it clearly helps. More sad than anxious. Consider duloxetine switch at FU.  Today not time for change.  FU 3 mos   Meredith Staggersarey Cottle, MD, DFAPA  Please see After Visit Summary for patient specific instructions.  No future appointments.   No orders of the defined types were placed in this encounter.      -------------------------------

## 2022-06-11 ENCOUNTER — Telehealth: Payer: Self-pay

## 2022-06-11 NOTE — Telephone Encounter (Addendum)
Prior Authorization Lisdexamfetamine 40 mg BID [employer]  PA for 40 mg BID denied

## 2022-07-01 ENCOUNTER — Telehealth: Payer: Self-pay | Admitting: Psychiatry

## 2022-07-01 ENCOUNTER — Telehealth: Payer: Self-pay

## 2022-07-01 NOTE — Telephone Encounter (Signed)
PA initiated

## 2022-07-01 NOTE — Telephone Encounter (Signed)
Next visit is 09/09/22. Phin[facility] is requesting a refill on Vyvanse 60 mg. [facility] is requesting a prior authorization for her Vyvanse. They told her the generic Vyvanse 60 mg for 30 days is $600.00. [facility] is requesting a prior auth for it. Pharmacy is:  [facility], [place]     Phone: [phone]  Fax: [phone]

## 2022-07-04 NOTE — Telephone Encounter (Signed)
Prior Authorization Lisdexamfetamine 40 mg  [employer]  Approved Effective:  07/03/22-07/04/23.

## 2022-07-12 ENCOUNTER — Other Ambulatory Visit: Payer: Self-pay | Admitting: Psychiatry

## 2022-07-12 ENCOUNTER — Telehealth: Payer: Self-pay

## 2022-07-12 DIAGNOSIS — F5081 Binge eating disorder: Secondary | ICD-10-CM

## 2022-07-12 MED ORDER — LISDEXAMFETAMINE DIMESYLATE 40 MG PO CAPS: 40.0000 mg | ORAL_CAPSULE | ORAL | 0 refills | Status: DC

## 2022-07-12 NOTE — Telephone Encounter (Signed)
Let her know that I am going to rewrite the prescription as Vyvanse 40 mg in the morning and 30 mg at noon.  This should help with the evening but she will have to copayments.  I sent the 40 mg prescription and just now for 1 in the morning.  I want to wait and send the 30 prescription in next week in hopes that if they are not send at the same time the insurance company may be more likely to approve it.

## 2022-07-12 NOTE — Telephone Encounter (Signed)
Patient's PA for 40 mg BID Vyvanse has been denied. Talked with patient today and she still has medication. What is the next step?

## 2022-07-12 NOTE — Telephone Encounter (Signed)
Prior Authorization submitted and denied TWICE for generic Vyvanse 40 mg bid due to over the quantity in additional to being over the FDA approved dosing.    Will update Dr. Clovis Pu and advise on how to proceed.

## 2022-07-15 NOTE — Telephone Encounter (Signed)
Notified patient. She said her husband was going to pick up the 40 mg at [facility] today.

## 2022-07-17 ENCOUNTER — Other Ambulatory Visit: Payer: Self-pay | Admitting: Psychiatry

## 2022-07-17 MED ORDER — LISDEXAMFETAMINE DIMESYLATE 30 MG PO CAPS: 30.0000 mg | ORAL_CAPSULE | Freq: Every day | ORAL | 0 refills | Status: DC

## 2022-07-17 NOTE — Telephone Encounter (Signed)
Let her know I sent the Vyvanse prescription.  We will see if it will go through insurance.

## 2022-07-17 NOTE — Telephone Encounter (Signed)
Patient notified

## 2022-07-18 ENCOUNTER — Telehealth: Payer: Self-pay

## 2022-07-18 NOTE — Telephone Encounter (Addendum)
Prior Authorization Lisdexamfetamine Dimesylate 30MG  capsules #30 [employer]  Approved Effective:  07/19/2022 to 07/20/2023

## 2022-08-12 ENCOUNTER — Other Ambulatory Visit: Payer: Self-pay

## 2022-08-12 ENCOUNTER — Telehealth: Payer: Self-pay | Admitting: Psychiatry

## 2022-08-12 ENCOUNTER — Other Ambulatory Visit: Payer: Self-pay | Admitting: Psychiatry

## 2022-08-12 DIAGNOSIS — F5081 Binge eating disorder: Secondary | ICD-10-CM

## 2022-08-12 MED ORDER — LISDEXAMFETAMINE DIMESYLATE 40 MG PO CAPS: 40.0000 mg | ORAL_CAPSULE | ORAL | 0 refills | Status: DC

## 2022-08-12 NOTE — Telephone Encounter (Signed)
Pended.

## 2022-08-12 NOTE — Telephone Encounter (Signed)
Pt requesting Rx Vyvance 40 mg to [facility]. Apt 3/18

## 2022-08-23 ENCOUNTER — Telehealth: Payer: Commercial Managed Care - HMO | Admitting: Physician Assistant

## 2022-08-23 DIAGNOSIS — J069 Acute upper respiratory infection, unspecified: Secondary | ICD-10-CM

## 2022-08-23 DIAGNOSIS — B9689 Other specified bacterial agents as the cause of diseases classified elsewhere: Secondary | ICD-10-CM

## 2022-08-23 MED ORDER — AZITHROMYCIN 250 MG PO TABS: ORAL_TABLET | ORAL | 0 refills | Status: AC

## 2022-08-23 NOTE — Progress Notes (Signed)
Virtual Visit Consent   Katherine [name], you are scheduled for a virtual visit with a [facility] provider today. Just as with appointments in the office, your consent must be obtained to participate. Your consent will be active for this visit and any virtual visit you may have with one of our providers in the next 365 days. If you have a MyChart account, a copy of this consent can be sent to you electronically.  As this is a virtual visit, video technology does not allow for your provider to perform a traditional examination. This may limit your provider's ability to fully assess your condition. If your provider identifies any concerns that need to be evaluated in person or the need to arrange testing (such as labs, EKG, etc.), we will make arrangements to do so. Although advances in technology are sophisticated, we cannot ensure that it will always work on either your end or our end. If the connection with a video visit is poor, the visit may have to be switched to a telephone visit. With either a video or telephone visit, we are not always able to ensure that we have a secure connection.  By engaging in this virtual visit, you consent to the provision of healthcare and authorize for your insurance to be billed (if applicable) for the services provided during this visit. Depending on your insurance coverage, you may receive a charge related to this service.  I need to obtain your verbal consent now. Are you willing to proceed with your visit today? Katherine [name] has provided verbal consent on 08/23/2022 for a virtual visit (video or telephone). Mar Daring, PA-C  Date: 08/23/2022 9:45 AM  Virtual Visit via Video Note   I, Mar Daring, connected with  Katherine [name]  ([id number], 04-22-1976) on 08/23/22 at  9:45 AM EST by a video-enabled telemedicine application and verified that I am speaking with the correct person using two identifiers.  Location: Patient: Virtual Visit Location Patient:  Home Provider: Virtual Visit Location Provider: Home Office   I discussed the limitations of evaluation and management by telemedicine and the availability of in person appointments. The patient expressed understanding and agreed to proceed.    History of Present Illness: Katherine [name] is a 47 y.o. who identifies as a female who was assigned female at birth, and is being seen today for URI symptoms.  HPI: URI  This is a new problem. The current episode started 1 to 4 weeks ago (2 weeks). The problem has been gradually worsening. There has been no fever (subjective fevers first couple of days). Associated symptoms include chest pain (heaviness), congestion, coughing, headaches, a plugged ear sensation (every once in a while has popping sensation), rhinorrhea, sinus pain and a sore throat. Pertinent negatives include no diarrhea, ear pain, nausea, vomiting or wheezing. Associated symptoms comments: Fatigue, DOE. Treatments tried: mucinex. The treatment provided no relief.     Problems:  Patient Active Problem List   Diagnosis Date Noted   Menopausal symptoms 10/21/2017   Physical exam 06/08/2014   Depression 10/07/2013   Migraines 10/07/2013   Nail abnormality 10/07/2013   Palpitations 01/09/2011   Pain in the chest 01/09/2011   Hypothyroidism 06/06/2009   HYPERCHOLESTEROLEMIA 06/06/2009    Allergies: No Known Allergies Medications:  Current Outpatient Medications:    lisdexamfetamine (VYVANSE) 30 MG capsule, Take 1 capsule (30 mg total) by mouth daily at 12 noon., Disp: 30 capsule, Rfl: 0   amphetamine-dextroamphetamine (ADDERALL XR) 25 MG  24 hr capsule, Take 1 capsule by mouth daily at 12 noon. (Patient not taking: Reported on 06/10/2022), Disp: 30 capsule, Rfl: 0   azithromycin (ZITHROMAX) 250 MG tablet, Take 2 tablets on day 1, then 1 tablet daily on days 2 through 5, Disp: 6 tablet, Rfl: 0   levothyroxine (SYNTHROID) 137 MCG tablet, TAKE 1 TABLET BY MOUTH BEFORE BREAKFAST, Disp: 90  tablet, Rfl: 0   levothyroxine (SYNTHROID) 150 MCG tablet, Take 1 tablet (150 mcg total) by mouth daily., Disp: 90 tablet, Rfl: 3   lisdexamfetamine (VYVANSE) 40 MG capsule, Take 1 capsule (40 mg total) by mouth every morning., Disp: 30 capsule, Rfl: 0   lisdexamfetamine (VYVANSE) 40 MG capsule, Take 1 capsule (40 mg total) by mouth every morning., Disp: 30 capsule, Rfl: 0   loratadine (CLARITIN) 10 MG tablet, Take 10 mg by mouth daily., Disp: , Rfl:    rizatriptan (MAXALT) 10 MG tablet, Take 1 tablet (10 mg total) by mouth daily as needed. May repeat in 2 hours if needed, Disp: 10 tablet, Rfl: 2   rosuvastatin (CRESTOR) 10 MG tablet, Take 1 tablet (10 mg total) by mouth daily., Disp: 90 tablet, Rfl: 1   sertraline (ZOLOFT) 100 MG tablet, Take 2 tablets by mouth once daily, Disp: 180 tablet, Rfl: 0   valACYclovir (VALTREX) 500 MG tablet, Take 1 tablet (500 mg total) by mouth 2 (two) times daily as needed (outbreak)., Disp: 60 tablet, Rfl: 1  Observations/Objective: Patient is well-developed, well-nourished in no acute distress.  Resting comfortably at home.  Head is normocephalic, atraumatic.  No labored breathing.  Speech is clear and coherent with logical content.  Patient is alert and oriented at baseline.    Assessment and Plan: 1. Bacterial upper respiratory infection - azithromycin (ZITHROMAX) 250 MG tablet; Take 2 tablets on day 1, then 1 tablet daily on days 2 through 5  Dispense: 6 tablet; Refill: 0  - Worsening over a week despite OTC medications - Will treat with Z-pack - Can continue Mucinex  - Push fluids.  - Rest.  - Steam and humidifier can help - Seek in person evaluation if worsening or symptoms fail to improve    Follow Up Instructions: I discussed the assessment and treatment plan with the patient. The patient was provided an opportunity to ask questions and all were answered. The patient agreed with the plan and demonstrated an understanding of the instructions.   A copy of instructions were sent to the patient via MyChart unless otherwise noted below.    The patient was advised to call back or seek an in-person evaluation if the symptoms worsen or if the condition fails to improve as anticipated.  Time:  I spent 8 minutes with the patient via telehealth technology discussing the above problems/concerns.    Mar Daring, PA-C

## 2022-08-23 NOTE — Patient Instructions (Signed)
Katherine [name], thank you for joining Mar Daring, PA-C for today's virtual visit.  While this provider is not your primary care provider (PCP), if your PCP is located in our provider database this encounter information will be shared with them immediately following your visit.   [facility] account gives you access to today's visit and all your visits, tests, and labs performed at [facility] " click here if you don't have a [facility] account or go to [email or web address]  Consent: (Patient) Katherine [name] provided verbal consent for this virtual visit at the beginning of the encounter.  Current Medications:  Current Outpatient Medications:    azithromycin (ZITHROMAX) 250 MG tablet, Take 2 tablets on day 1, then 1 tablet daily on days 2 through 5, Disp: 6 tablet, Rfl: 0   lisdexamfetamine (VYVANSE) 30 MG capsule, Take 1 capsule (30 mg total) by mouth daily at 12 noon., Disp: 30 capsule, Rfl: 0   amphetamine-dextroamphetamine (ADDERALL XR) 25 MG 24 hr capsule, Take 1 capsule by mouth daily at 12 noon. (Patient not taking: Reported on 06/10/2022), Disp: 30 capsule, Rfl: 0   levothyroxine (SYNTHROID) 137 MCG tablet, TAKE 1 TABLET BY MOUTH BEFORE BREAKFAST, Disp: 90 tablet, Rfl: 0   levothyroxine (SYNTHROID) 150 MCG tablet, Take 1 tablet (150 mcg total) by mouth daily., Disp: 90 tablet, Rfl: 3   lisdexamfetamine (VYVANSE) 40 MG capsule, Take 1 capsule (40 mg total) by mouth every morning., Disp: 30 capsule, Rfl: 0   lisdexamfetamine (VYVANSE) 40 MG capsule, Take 1 capsule (40 mg total) by mouth every morning., Disp: 30 capsule, Rfl: 0   loratadine (CLARITIN) 10 MG tablet, Take 10 mg by mouth daily., Disp: , Rfl:    rizatriptan (MAXALT) 10 MG tablet, Take 1 tablet (10 mg total) by mouth daily as needed. May repeat in 2 hours if needed, Disp: 10 tablet, Rfl: 2   rosuvastatin (CRESTOR) 10 MG tablet, Take 1 tablet (10 mg total) by mouth daily., Disp: 90 tablet,  Rfl: 1   sertraline (ZOLOFT) 100 MG tablet, Take 2 tablets by mouth once daily, Disp: 180 tablet, Rfl: 0   valACYclovir (VALTREX) 500 MG tablet, Take 1 tablet (500 mg total) by mouth 2 (two) times daily as needed (outbreak)., Disp: 60 tablet, Rfl: 1   Medications ordered in this encounter:  Meds ordered this encounter  Medications   azithromycin (ZITHROMAX) 250 MG tablet    Sig: Take 2 tablets on day 1, then 1 tablet daily on days 2 through 5    Dispense:  6 tablet    Refill:  0    Order Specific Question:   Supervising Provider    Answer:   Chase Picket [id number]     *If you need refills on other medications prior to your next appointment, please contact your pharmacy*  Follow-Up: Call back or seek an in-person evaluation if the symptoms worsen or if the condition fails to improve as anticipated.  [place] [phone]  Other Instructions  Acute Bronchitis, Adult  Acute bronchitis is sudden inflammation of the main airways (bronchi) that come off the windpipe (trachea) in the lungs. The swelling causes the airways to get smaller and make more mucus than normal. This can make it hard to breathe and can cause coughing or noisy breathing (wheezing). Acute bronchitis may last several weeks. The cough may last longer. Allergies, asthma, and exposure to smoke may make the condition worse. What are the causes? This condition  can be caused by germs and by substances that irritate the lungs, including: Cold and flu viruses. The most common cause of this condition is the virus that causes the common cold. Bacteria. This is less common. Breathing in substances that irritate the lungs, including: Smoke from cigarettes and other forms of tobacco. Dust and pollen. Fumes from household cleaning products, gases, or burned fuel. Indoor or outdoor air pollution. What increases the risk? The following factors may make you more likely to develop this condition: A weak  body's defense system, also called the immune system. A condition that affects your lungs and breathing, such as asthma. What are the signs or symptoms? Common symptoms of this condition include: Coughing. This may bring up clear, yellow, or green mucus from your lungs (sputum). Wheezing. Runny or stuffy nose. Having too much mucus in your lungs (chest congestion). Shortness of breath. Aches and pains, including sore throat or chest. How is this diagnosed? This condition is usually diagnosed based on: Your symptoms and medical history. A physical exam. You may also have other tests, including tests to rule out other conditions, such as pneumonia. These tests include: A test of lung function. Test of a mucus sample to look for the presence of bacteria. Tests to check the oxygen level in your blood. Blood tests. Chest X-ray. How is this treated? Most cases of acute bronchitis clear up over time without treatment. Your health care provider may recommend: Drinking more fluids to help thin your mucus so it is easier to cough up. Taking inhaled medicine (inhaler) to improve air flow in and out of your lungs. Using a vaporizer or a humidifier. These are machines that add water to the air to help you breathe better. Taking a medicine that thins mucus and clears congestion (expectorant). Taking a medicine that prevents or stops coughing (cough suppressant). It is not common to take an antibiotic medicine for this condition. Follow these instructions at home:  Take over-the-counter and prescription medicines only as told by your health care provider. Use an inhaler, vaporizer, or humidifier as told by your health care provider. Take two teaspoons (10 mL) of honey at bedtime to lessen coughing at night. Drink enough fluid to keep your urine pale yellow. Do not use any products that contain nicotine or tobacco. These products include cigarettes, chewing tobacco, and vaping devices, such as  e-cigarettes. If you need help quitting, ask your health care provider. Get plenty of rest. Return to your normal activities as told by your health care provider. Ask your health care provider what activities are safe for you. Keep all follow-up visits. This is important. How is this prevented? To lower your risk of getting this condition again: Wash your hands often with soap and water for at least 20 seconds. If soap and water are not available, use hand sanitizer. Avoid contact with people who have cold symptoms. Try not to touch your mouth, nose, or eyes with your hands. Avoid breathing in smoke or chemical fumes. Breathing smoke or chemical fumes will make your condition worse. Get the flu shot every year. Contact a health care provider if: Your symptoms do not improve after 2 weeks. You have trouble coughing up the mucus. Your cough keeps you awake at night. You have a fever. Get help right away if you: Cough up blood. Feel pain in your chest. Have severe shortness of breath. Faint or keep feeling like you are going to faint. Have a severe headache. Have a fever or chills  that get worse. These symptoms may represent a serious problem that is an emergency. Do not wait to see if the symptoms will go away. Get medical help right away. Call your local emergency services (911 in the [place] Do not drive yourself to the hospital. Summary Acute bronchitis is inflammation of the main airways (bronchi) that come off the windpipe (trachea) in the lungs. The swelling causes the airways to get smaller and make more mucus than normal. Drinking more fluids can help thin your mucus so it is easier to cough up. Take over-the-counter and prescription medicines only as told by your health care provider. Do not use any products that contain nicotine or tobacco. These products include cigarettes, chewing tobacco, and vaping devices, such as e-cigarettes. If you need help quitting, ask your health care  provider. Contact a health care provider if your symptoms do not improve after 2 weeks. This information is not intended to replace advice given to you by your health care provider. Make sure you discuss any questions you have with your health care provider. Document Revised: 09/20/2021 Document Reviewed: 10/11/2020 Elsevier Patient Education  [place].    If you have been instructed to have an in-person evaluation today at a local Urgent Care facility, please use the link below. It will take you to a list of all of our available [facility], including address, phone number and hours of operation. Please do not delay care.  [facility]  If you or a family member do not have a primary care provider, use the link below to schedule a visit and establish care. When you choose a [facility] primary care physician or advanced practice provider, you gain a long-term partner in health. Find a Primary Care Provider  Learn more about [facility] in-office and virtual care options: [facility]

## 2022-09-09 ENCOUNTER — Ambulatory Visit: Payer: 59 | Admitting: Psychiatry

## 2022-09-13 ENCOUNTER — Other Ambulatory Visit: Payer: Self-pay | Admitting: Psychiatry

## 2022-09-13 DIAGNOSIS — F5081 Binge eating disorder: Secondary | ICD-10-CM

## 2022-09-13 DIAGNOSIS — F411 Generalized anxiety disorder: Secondary | ICD-10-CM

## 2022-09-14 ENCOUNTER — Telehealth: Payer: Self-pay

## 2022-09-14 NOTE — Telephone Encounter (Signed)
Prior Authorization submitted and approved for Lisdexamfetamine 40 mg capsule PA# [id number] Date:09/14/2022;Coverage End Date:09/14/2023; with [place]

## 2022-09-16 ENCOUNTER — Telehealth: Payer: Self-pay | Admitting: Family Medicine

## 2022-09-16 NOTE — Telephone Encounter (Signed)
Left pt a Vm stating she will need an apt to be seen and we can redraw the TSH . Last one was in 03/2022

## 2022-09-16 NOTE — Telephone Encounter (Signed)
Caller name: ALEGANDRA USUI  On DPR?: Yes  Call back number: [phone] (mobile)  Provider they see: Midge Minium, MD  Reason for call:Pt feels her thyroid medicine is too high dosage and would like it lowered

## 2022-09-24 ENCOUNTER — Telehealth: Payer: Self-pay

## 2022-09-24 ENCOUNTER — Encounter: Payer: Self-pay | Admitting: Family Medicine

## 2022-09-24 ENCOUNTER — Other Ambulatory Visit: Payer: Self-pay

## 2022-09-24 ENCOUNTER — Ambulatory Visit (INDEPENDENT_AMBULATORY_CARE_PROVIDER_SITE_OTHER): Payer: Commercial Managed Care - HMO | Admitting: Family Medicine

## 2022-09-24 VITALS — BP 122/80 | HR 77 | Temp 98.0°F | Resp 17 | Ht 64.0 in | Wt 131.2 lb

## 2022-09-24 DIAGNOSIS — E7800 Pure hypercholesterolemia, unspecified: Secondary | ICD-10-CM

## 2022-09-24 DIAGNOSIS — E039 Hypothyroidism, unspecified: Secondary | ICD-10-CM

## 2022-09-24 LAB — LIPID PANEL
Cholesterol: 180 mg/dL (ref 0–200)
HDL: 94.5 mg/dL (ref >39.00)
LDL Cholesterol: 76 mg/dL (ref 0–99)
NonHDL: 85.15
Total CHOL/HDL Ratio: 2
Triglycerides: 46 mg/dL (ref 0.0–149.0)
VLDL: 9.2 mg/dL (ref 0.0–40.0)

## 2022-09-24 LAB — HEPATIC FUNCTION PANEL
ALT: 13 U/L (ref 0–35)
AST: 18 U/L (ref 0–37)
Albumin: 4.2 g/dL (ref 3.5–5.2)
Alkaline Phosphatase: 37 U/L — ABNORMAL LOW (ref 39–117)
Bilirubin, Direct: 0.1 mg/dL (ref 0.0–0.3)
Total Bilirubin: 0.5 mg/dL (ref 0.2–1.2)
Total Protein: 6.2 g/dL (ref 6.0–8.3)

## 2022-09-24 LAB — TSH: TSH: 0.04 u[IU]/mL — ABNORMAL LOW (ref 0.35–5.50)

## 2022-09-24 LAB — BASIC METABOLIC PANEL
BUN: 15 mg/dL (ref 6–23)
CO2: 30 mEq/L (ref 19–32)
Calcium: 9.1 mg/dL (ref 8.4–10.5)
Chloride: 105 mEq/L (ref 96–112)
Creatinine, Ser: 0.83 mg/dL (ref 0.40–1.20)
GFR: 82.89 mL/min (ref >60.00)
Glucose, Bld: 84 mg/dL (ref 70–99)
Potassium: 3.9 mEq/L (ref 3.5–5.1)
Sodium: 140 mEq/L (ref 135–145)

## 2022-09-24 LAB — T4, FREE: Free T4: 1.09 ng/dL (ref 0.60–1.60)

## 2022-09-24 LAB — T3, FREE: T3, Free: 3.5 pg/mL (ref 2.3–4.2)

## 2022-09-24 MED ORDER — LEVOTHYROXINE SODIUM 125 MCG PO TABS
125.0000 ug | ORAL_TABLET | Freq: Every day | ORAL | 3 refills | Status: DC
Start: 2022-09-24 — End: 2022-10-16

## 2022-09-24 NOTE — Progress Notes (Signed)
   Subjective:    Patient ID: Katherine [name], female    DOB: April 15, 1976, 47 y.o.   MRN: [id number]  HPI Hypothyroid- chronic problem, on Levothyroxine 140mcg daily.  Pt feels that dose is 'too high'.  Sometimes will intentionally miss medication.  Pt reports skin 'is very dry', 'my hair falls out', and having hot flashes.  Pt reports when she holds medication hot flashes are not as frequent or severe.  Energy level is consistent.  Denies palpitations.    Hyperlipidemia- chronic problem, on Crestor 10mg  daily.  No abd pain, N/V.   Review of Systems For ROS see HPI     Objective:   Physical Exam Vitals reviewed.  Constitutional:      General: She is not in acute distress.    Appearance: Normal appearance. She is well-developed. She is not ill-appearing.  HENT:     Head: Normocephalic and atraumatic.  Eyes:     Conjunctiva/sclera: Conjunctivae normal.     Pupils: Pupils are equal, round, and reactive to light.  Neck:     Thyroid: No thyromegaly.  Cardiovascular:     Rate and Rhythm: Normal rate and regular rhythm.     Pulses: Normal pulses.     Heart sounds: Normal heart sounds. No murmur heard. Pulmonary:     Effort: Pulmonary effort is normal. No respiratory distress.     Breath sounds: Normal breath sounds.  Abdominal:     General: There is no distension.     Palpations: Abdomen is soft.     Tenderness: There is no abdominal tenderness.  Musculoskeletal:     Cervical back: Normal range of motion and neck supple.  Lymphadenopathy:     Cervical: No cervical adenopathy.  Skin:    General: Skin is warm and dry.  Neurological:     General: No focal deficit present.     Mental Status: She is alert and oriented to person, place, and time.  Psychiatric:        Mood and Affect: Mood normal.        Behavior: Behavior normal.           Assessment & Plan:

## 2022-09-24 NOTE — Patient Instructions (Signed)
Schedule your complete physical in 3 months We'll notify you of your lab results and make any changes if needed Keep up the good work on healthy diet and regular exercise- you look great!! Call with any questions or concerns Stay Safe!  Stay Healthy! Happy Spring!!!

## 2022-09-24 NOTE — Assessment & Plan Note (Signed)
Deteriorated.  Pt feels that symptoms are not controlled and that she is having side effects from medication.  Feels that dose is too high and that when she holds her meds, hot flashes are not as frequent or severe.  Will check TSH and free T3/T4 to determine what adjustments need to be made.  Pt expressed understanding and is in agreement w/ plan.

## 2022-09-24 NOTE — Telephone Encounter (Signed)
Informed pt of lab results and lab only visit has been made . Repeat TSH order is in and levothyroxine 125 mg has been sent to pharmacy

## 2022-09-24 NOTE — Telephone Encounter (Signed)
-----   Message from Midge Minium, MD sent at 09/24/2022 12:53 PM EDT ----- Dennis Bast are correct that your TSH level is low- meaning your Levothyroxine dose is too high.  We will decrease this to 172mcg daily (#30, 3 refills) and repeat your TSH level at a lab only visit in 1 month (dx hypothyroid)  Remainder of labs look great!

## 2022-09-24 NOTE — Assessment & Plan Note (Signed)
Chronic problem.  Tolerating Crestor 10mg daily w/o difficulty.  Check labs.  Adjust meds prn  

## 2022-10-08 ENCOUNTER — Ambulatory Visit: Payer: 59 | Admitting: Psychiatry

## 2022-10-15 ENCOUNTER — Encounter: Payer: Self-pay | Admitting: Family Medicine

## 2022-10-15 ENCOUNTER — Other Ambulatory Visit (INDEPENDENT_AMBULATORY_CARE_PROVIDER_SITE_OTHER): Payer: Commercial Managed Care - HMO

## 2022-10-15 ENCOUNTER — Telehealth: Payer: Self-pay

## 2022-10-15 ENCOUNTER — Other Ambulatory Visit: Payer: Self-pay

## 2022-10-15 DIAGNOSIS — E039 Hypothyroidism, unspecified: Secondary | ICD-10-CM

## 2022-10-15 LAB — TSH: TSH: 0.07 u[IU]/mL — ABNORMAL LOW (ref 0.35–5.50)

## 2022-10-15 NOTE — Telephone Encounter (Signed)
-----   Message from Sheliah Hatch, MD sent at 10/15/2022 12:38 PM EDT ----- Your TSH level remains low.  We will add a free T3 and free T4 to determine the actual function of the thyroid and decide if we need to adjust your medications.  How have you been feeling?

## 2022-10-15 NOTE — Telephone Encounter (Signed)
Pt seen results Via my chart . Add on T3 and 4 has been faxed to lab

## 2022-10-16 ENCOUNTER — Other Ambulatory Visit (INDEPENDENT_AMBULATORY_CARE_PROVIDER_SITE_OTHER): Payer: Commercial Managed Care - HMO

## 2022-10-16 DIAGNOSIS — E039 Hypothyroidism, unspecified: Secondary | ICD-10-CM | POA: Diagnosis not present

## 2022-10-16 LAB — T3, FREE: T3, Free: 3.2 pg/mL (ref 2.3–4.2)

## 2022-10-16 LAB — T4, FREE: Free T4: 1.19 ng/dL (ref 0.60–1.60)

## 2022-10-16 MED ORDER — LEVOTHYROXINE SODIUM 112 MCG PO TABS
112.0000 ug | ORAL_TABLET | Freq: Every day | ORAL | 3 refills | Status: DC
Start: 2022-10-16 — End: 2023-09-01

## 2022-10-22 ENCOUNTER — Other Ambulatory Visit: Payer: Self-pay

## 2022-10-22 DIAGNOSIS — F5081 Binge eating disorder: Secondary | ICD-10-CM

## 2022-10-22 MED ORDER — LISDEXAMFETAMINE DIMESYLATE 40 MG PO CAPS
40.0000 mg | ORAL_CAPSULE | ORAL | 0 refills | Status: DC
Start: 2022-10-22 — End: 2022-10-29

## 2022-10-22 MED ORDER — LISDEXAMFETAMINE DIMESYLATE 30 MG PO CAPS
30.0000 mg | ORAL_CAPSULE | ORAL | 0 refills | Status: DC
Start: 2022-10-22 — End: 2022-12-06

## 2022-10-24 ENCOUNTER — Other Ambulatory Visit: Payer: Commercial Managed Care - HMO

## 2022-10-29 ENCOUNTER — Encounter: Payer: Self-pay | Admitting: Psychiatry

## 2022-10-29 ENCOUNTER — Ambulatory Visit (INDEPENDENT_AMBULATORY_CARE_PROVIDER_SITE_OTHER): Payer: Commercial Managed Care - HMO | Admitting: Psychiatry

## 2022-10-29 DIAGNOSIS — F5105 Insomnia due to other mental disorder: Secondary | ICD-10-CM | POA: Diagnosis not present

## 2022-10-29 DIAGNOSIS — F900 Attention-deficit hyperactivity disorder, predominantly inattentive type: Secondary | ICD-10-CM | POA: Diagnosis not present

## 2022-10-29 DIAGNOSIS — F411 Generalized anxiety disorder: Secondary | ICD-10-CM

## 2022-10-29 DIAGNOSIS — F5081 Binge eating disorder: Secondary | ICD-10-CM

## 2022-10-29 MED ORDER — SERTRALINE HCL 100 MG PO TABS: 200.0000 mg | ORAL_TABLET | Freq: Every day | ORAL | 1 refills | Status: DC

## 2022-10-29 MED ORDER — LISDEXAMFETAMINE DIMESYLATE 40 MG PO CAPS
40.0000 mg | ORAL_CAPSULE | ORAL | 0 refills | Status: DC
Start: 2022-10-29 — End: 2023-02-26

## 2022-10-29 NOTE — Progress Notes (Signed)
ALEIGHNA TELLES [id number] Jan 29, 1976 47 y.o.  Subjective:   Patient ID:  DELCIE ZIFF is a 47 y.o. (DOB 05/02/1976) female.  Chief Complaint:  Chief Complaint  Patient presents with   Follow-up   Anxiety   Eating Disorder    Anxiety Patient reports no confusion, decreased concentration, nervous/anxious behavior, palpitations or suicidal ideas.    Depression        Associated symptoms include no decreased concentration and no suicidal ideas.  Past medical history includes anxiety.    [name] presents to the office today for follow-up of  EDO and mood. And stress.  seen in January 2021.  No meds were changed.  01/19/2020 appointment with the following noted: Mood has been OK.  Dog with cancer. Normal sadness. Tolerating meds except lower dose Topomax DT word-finding problems.  less struggles with some depression chronically.  Managing OK.  Can't go to gym which helped her mood.  Handling better than she did.   Sleep better now without meds. No concerns about the meds.  Vyvanse helps but wonders if she' more tolerant to it.  Can't take more topomax without cog problems.  Still binge eats.  Generally not depressed.  Function is okay.  Still gets anxious and stressed but has triggers for that.  Tolerates meds.  No change in binge eating pattern. Sleep affected by stress and don't want sleep meds. No new meds.  07/17/2020 appointment with following noted: Covid December through holidays.  Ed hospitalized briefly and recovered. Doing OK.  Louis at [employer] and going OK so far. Most of the time feels good.  Big transition having Louis come home and everyone sick. EDO not the best with holidays and stress and change in routine.  Would be great if there was something that could make it go away. Plan no changes  01/02/21 appt noted: About the same.  No local studies available for her to particpate in. No SE. Unless cog SE with topiramate over 50 mg daily. Patient reports  stable mood and denies depressed or irritable moods.  Patient denies any recent difficulty with anxiety.  Patient denies difficulty with sleep initiation or maintenance. Denies appetite disturbance.  Patient reports that energy and motivation have been good.  Patient denies any difficulty with concentration.  Patient denies any suicidal ideation. Still dealing with EDO. Louis is better but still loses control at times in public and at home.  His school went well. Plan: Disc potential off label naltrexone 25 mg - 50 mg q PM.  Disc SE and she agrees  07/05/2021 appointment with the following noted: Forgot naltrexone too much so not taking it. Still Vyvanse 60 and sertraline 200 mg daily. Has been fine over all with mood and anxiety. Still dealing with EDO issues. No Se with meds. Patient reports stable mood and denies depressed or irritable moods.  Patient denies any recent difficulty with anxiety.  Patient denies difficulty with sleep initiation or maintenance. Denies appetite disturbance.  Patient reports that energy and motivation have been good.  Patient denies any difficulty with concentration.  Patient denies any suicidal ideation. Still dealing with thyroid problems. Plan: Disc potential off label naltrexone 25 mg - 50 mg q PM.  Disc SE and she agrees  01/02/22 appt noted: OK overall but things at house are getting rough again. She doesn't think things are better but Ed diminishes that he does it. Louis can't regulate food intake won't stop eating until he throws up.  Needs antipsychotic.  Can't self regulate. Outside of the house Lissa Hoard is OK.  He needs constant stimulation.  He paces and can't settle down. Tried naltrexone a couple of times but not consistent with it.  No effects or SE noted.   She feels overall her meds ok.  She is not happy. Louis going to [employer] school in August and expects to make things better at home.  06/10/22 appt noted: Doing OK overall.  No major changes.   Not purging as much but still binging and mostly at night 7pm on. Taking Vyvanse about 10 AM.  No trouble falling asleep. No night eating.  Gets 8 hours of sleep.   If forgets Vyvanse then has binging purging day.  Would like to see this get better. No sig depression or anxiety. No SE  10/29/22 appt noted; Never able to get the Vyvanse BID until yesterdahy. Would like to do this for binge eating.  Does not bother her sleep.   Doing ok.  Mood and anxiety are doing ok.   No SE issues with meds. Still working on thyroid issues.  Normal BP.  Louis doing well now.    Past Psychiatric Medication Trials: NAC capsules heartburn,  topiramate 100 Cog SE, sertraline 200,  naltrexone,  Vyvanse  Review of Systems:  Review of Systems  Cardiovascular:  Negative for palpitations.  Gastrointestinal:  Negative for constipation.  Neurological:  Negative for tremors.  Psychiatric/Behavioral:  Negative for agitation, behavioral problems, confusion, decreased concentration, dysphoric mood, hallucinations, self-injury, sleep disturbance and suicidal ideas. The patient is not nervous/anxious and is not hyperactive.     Medications: I have reviewed the patient's current medications.  Current Outpatient Medications  Medication Sig Dispense Refill   levothyroxine (SYNTHROID) 112 MCG tablet Take 1 tablet (112 mcg total) by mouth daily. 30 tablet 3   lisdexamfetamine (VYVANSE) 30 MG capsule Take 1 capsule (30 mg total) by mouth every morning. 30 capsule 0   loratadine (CLARITIN) 10 MG tablet Take 10 mg by mouth daily.     rizatriptan (MAXALT) 10 MG tablet Take 1 tablet (10 mg total) by mouth daily as needed. May repeat in 2 hours if needed 10 tablet 2   rosuvastatin (CRESTOR) 10 MG tablet Take 1 tablet (10 mg total) by mouth daily. 90 tablet 1   lisdexamfetamine (VYVANSE) 40 MG capsule Take 1 capsule (40 mg total) by mouth every morning. 90 capsule 0   sertraline (ZOLOFT) 100 MG tablet Take 2 tablets (200 mg  total) by mouth daily. 180 tablet 1   No current facility-administered medications for this visit.    Medication Side Effects: None  Allergies: No Known Allergies  Past Medical History:  Diagnosis Date   Migraine    Unspecified hypothyroidism     Family History  Problem Relation Age of Onset   Alcohol abuse Mother    Cancer Mother        lung   Heart disease Mother    Hypertension Mother    Hyperlipidemia Father    Heart disease Father    Hypertension Father    Diabetes Father    Cancer Other    Hypertension Other    Hyperlipidemia Other    Heart attack Other    Stroke Other     Social History   Socioeconomic History   Marital status: Married    Spouse name: Not on file   Number of children: 1   Years of education: Not on file   Highest education  level: Not on file  Occupational History    Employer: UNEMPLOYED  Tobacco Use   Smoking status: Former   Smokeless tobacco: Never  Building services engineer Use: Never used  Substance and Sexual Activity   Alcohol use: Yes    Comment: occasionally wine   Drug use: No   Sexual activity: Not on file  Other Topics Concern   Not on file  Social History Narrative   Not on file   Social Determinants of Health   Financial Resource Strain: Not on file  Food Insecurity: Not on file  Transportation Needs: Not on file  Physical Activity: Not on file  Stress: Not on file  Social Connections: Not on file  Intimate Partner Violence: Not on file    Past Medical History, Surgical history, Social history, and Family history were reviewed and updated as appropriate.   Please see review of systems for further details on the patient's review from today.   Objective:   Physical Exam:  There were no vitals taken for this visit.  Physical Exam Constitutional:      General: She is not in acute distress.    Appearance: She is well-developed.  Musculoskeletal:        General: No deformity.  Neurological:     Mental  Status: She is alert and oriented to person, place, and time.     Motor: No tremor.     Coordination: Coordination normal.     Gait: Gait normal.  Psychiatric:        Attention and Perception: Attention and perception normal.        Mood and Affect: Mood is not anxious or depressed. Affect is not labile, blunt or angry.        Speech: Speech normal.        Behavior: Behavior normal.        Thought Content: Thought content normal. Thought content is not delusional. Thought content does not include homicidal or suicidal ideation. Thought content does not include suicidal plan.        Cognition and Memory: Cognition normal.        Judgment: Judgment normal.     Comments: Insight intact. No auditory or visual hallucinations. No delusions.  Anxiety manageable. Minimal dep.      Lab Review:     Component Value Date/Time   NA 140 09/24/2022 0836   K 3.9 09/24/2022 0836   CL 105 09/24/2022 0836   CO2 30 09/24/2022 0836   GLUCOSE 84 09/24/2022 0836   BUN 15 09/24/2022 0836   CREATININE 0.83 09/24/2022 0836   CALCIUM 9.1 09/24/2022 0836   PROT 6.2 09/24/2022 0836   ALBUMIN 4.2 09/24/2022 0836   AST 18 09/24/2022 0836   ALT 13 09/24/2022 0836   ALKPHOS 37 (L) 09/24/2022 0836   BILITOT 0.5 09/24/2022 0836   GFRNONAA >60 01/10/2015 1910   GFRAA >60 01/10/2015 1910       Component Value Date/Time   WBC 4.0 01/21/2022 1000   RBC 4.69 01/21/2022 1000   HGB 13.9 01/21/2022 1000   HCT 42.2 01/21/2022 1000   PLT 213.0 01/21/2022 1000   MCV 90.0 01/21/2022 1000   MCH 30.8 01/10/2015 1910   MCHC 33.0 01/21/2022 1000   RDW 12.9 01/21/2022 1000   LYMPHSABS 1.4 01/21/2022 1000   MONOABS 0.3 01/21/2022 1000   EOSABS 0.2 01/21/2022 1000   BASOSABS 0.0 01/21/2022 1000    No results found for: "POCLITH", "LITHIUM"   No results  found for: "PHENYTOIN", "PHENOBARB", "VALPROATE", "CBMZ"   .res Assessment: Plan:    Alyaa was seen today for follow-up, anxiety and eating  disorder.  Diagnoses and all orders for this visit:  Binge eating disorder -     sertraline (ZOLOFT) 100 MG tablet; Take 2 tablets (200 mg total) by mouth daily. -     lisdexamfetamine (VYVANSE) 40 MG capsule; Take 1 capsule (40 mg total) by mouth every morning.  Generalized anxiety disorder -     sertraline (ZOLOFT) 100 MG tablet; Take 2 tablets (200 mg total) by mouth daily.  Attention deficit hyperactivity disorder (ADHD), predominantly inattentive type  Insomnia due to mental condition   Supportive therapy dealing with son disc in detail.  Disc him getting care.    Unlikely other meds for EDO would help.  The 2 best are Vyvanse and topiramate.  She had SE with topiramate.  Vyvanse does help her with the binge eating  and purging until the evening and then it gets worse.  Overall over the years less purging but still binging.    She has had counseling.   Disc pending generics.   Change Vyvanse to BID BC it's not helping binge eating in the evening. Need to extend duration.  Daily binging in the evening No problems with switch to generic Vyvanse.   Vyvanse 40 mg AM and 30 mg PM  contINUE sertraline 200  Anxiety is generally manageable with the sertraline it clearly helps. Mood is better.  FU 3 mos   Meredith Staggers, MD, DFAPA  Please see After Visit Summary for patient specific instructions.  No future appointments.   No orders of the defined types were placed in this encounter.      -------------------------------

## 2022-11-04 ENCOUNTER — Other Ambulatory Visit: Payer: Self-pay | Admitting: Family Medicine

## 2022-11-04 DIAGNOSIS — E782 Mixed hyperlipidemia: Secondary | ICD-10-CM

## 2022-11-06 ENCOUNTER — Other Ambulatory Visit: Payer: Self-pay

## 2022-11-06 DIAGNOSIS — Z1211 Encounter for screening for malignant neoplasm of colon: Secondary | ICD-10-CM

## 2022-11-26 ENCOUNTER — Telehealth: Payer: Self-pay | Admitting: Psychiatry

## 2022-11-26 NOTE — Telephone Encounter (Signed)
Pt lvm that she will also need a letter recommending that she would benefit from a service dog since she is the caretaker of lewis.

## 2022-11-29 ENCOUNTER — Encounter: Payer: Self-pay | Admitting: Psychiatry

## 2022-12-04 ENCOUNTER — Ambulatory Visit: Payer: Commercial Managed Care - HMO | Admitting: Psychiatry

## 2022-12-06 ENCOUNTER — Other Ambulatory Visit: Payer: Self-pay

## 2022-12-06 ENCOUNTER — Telehealth: Payer: Self-pay | Admitting: Psychiatry

## 2022-12-06 DIAGNOSIS — F5081 Binge eating disorder: Secondary | ICD-10-CM

## 2022-12-06 MED ORDER — LISDEXAMFETAMINE DIMESYLATE 30 MG PO CAPS
30.0000 mg | ORAL_CAPSULE | ORAL | 0 refills | Status: DC
Start: 2022-12-06 — End: 2023-02-26

## 2022-12-06 NOTE — Telephone Encounter (Signed)
Next appt is 01/28/23. Requesting refill on Vyvanse 30 mg. Katherine [name] said that Katherine [name] at [facility],  [name], [place] [address] [phone]  Per Azariyah it is in stock at above location.

## 2022-12-06 NOTE — Telephone Encounter (Signed)
Pended.

## 2022-12-10 ENCOUNTER — Ambulatory Visit (INDEPENDENT_AMBULATORY_CARE_PROVIDER_SITE_OTHER): Payer: Commercial Managed Care - HMO | Admitting: Psychiatry

## 2022-12-10 ENCOUNTER — Encounter: Payer: Self-pay | Admitting: Psychiatry

## 2022-12-10 DIAGNOSIS — F900 Attention-deficit hyperactivity disorder, predominantly inattentive type: Secondary | ICD-10-CM | POA: Diagnosis not present

## 2022-12-10 DIAGNOSIS — F5081 Binge eating disorder: Secondary | ICD-10-CM

## 2022-12-10 MED ORDER — PHENTERMINE HCL 37.5 MG PO CAPS
37.5000 mg | ORAL_CAPSULE | ORAL | 0 refills | Status: DC
Start: 2022-12-10 — End: 2023-01-07

## 2022-12-10 MED ORDER — AMPHETAMINE-DEXTROAMPHET ER 30 MG PO CP24
30.0000 mg | ORAL_CAPSULE | Freq: Two times a day (BID) | ORAL | 0 refills | Status: DC
Start: 2022-12-10 — End: 2022-12-10

## 2022-12-10 NOTE — Progress Notes (Signed)
Katherine [name] [id number] [birth year] 47 y.o.  Subjective:   [name] is a 47 y.o. (DOB 28-Feb-1976) female.  Chief Complaint:  Chief Complaint  Patient presents with   Follow-up    Anxiety Patient reports no confusion, decreased concentration, nervous/anxious behavior, palpitations or suicidal ideas.    Depression        Associated symptoms include no decreased concentration and no suicidal ideas.  Past medical history includes anxiety.    Katherine [name] presents to the office today for follow-up of  EDO and mood. And stress.  seen in January 2021.  No meds were changed.  01/19/2020 appointment with the following noted: Mood has been OK.  Dog with cancer. Normal sadness. Tolerating meds except lower dose Topomax DT word-finding problems.  less struggles with some depression chronically.  Managing OK.  Can't go to gym which helped her mood.  Handling better than she did.   Sleep better now without meds. No concerns about the meds.  Vyvanse helps but wonders if she' more tolerant to it.  Can't take more topomax without cog problems.  Still binge eats.  Generally not depressed.  Function is okay.  Still gets anxious and stressed but has triggers for that.  Tolerates meds.  No change in binge eating pattern. Sleep affected by stress and don't want sleep meds. No new meds.  07/17/2020 appointment with following noted: Covid December through holidays.  Ed hospitalized briefly and recovered. Doing OK.  Louis at [employer] and going OK so far. Most of the time feels good.  Big transition having Louis come home and everyone sick. EDO not the best with holidays and stress and change in routine.  Would be great if there was something that could make it go away. Plan no changes  01/02/21 appt noted: About the same.  No local studies available for her to particpate in. No SE. Unless cog SE with topiramate over 50 mg daily. Patient reports stable mood and denies depressed  or irritable moods.  Patient denies any recent difficulty with anxiety.  Patient denies difficulty with sleep initiation or maintenance. Denies appetite disturbance.  Patient reports that energy and motivation have been good.  Patient denies any difficulty with concentration.  Patient denies any suicidal ideation. Still dealing with EDO. Louis is better but still loses control at times in public and at home.  His school went well. Plan: Disc potential off label naltrexone 25 mg - 50 mg q PM.  Disc SE and she agrees  07/05/2021 appointment with the following noted: Forgot naltrexone too much so not taking it. Still Vyvanse 60 and sertraline 200 mg daily. Has been fine over all with mood and anxiety. Still dealing with EDO issues. No Se with meds. Patient reports stable mood and denies depressed or irritable moods.  Patient denies any recent difficulty with anxiety.  Patient denies difficulty with sleep initiation or maintenance. Denies appetite disturbance.  Patient reports that energy and motivation have been good.  Patient denies any difficulty with concentration.  Patient denies any suicidal ideation. Still dealing with thyroid problems. Plan: Disc potential off label naltrexone 25 mg - 50 mg q PM.  Disc SE and she agrees  01/02/22 appt noted: OK overall but things at house are getting rough again. She doesn't think things are better but Ed diminishes that he does it. Louis can't regulate food intake won't stop eating until he throws up.  Needs antipsychotic.  Can't self regulate. Outside  of the house Katherine [name] is OK.  He needs constant stimulation.  He paces and can't settle down. Tried naltrexone a couple of times but not consistent with it.  No effects or SE noted.   She feels overall her meds ok.  She is not happy. Louis going to [employer] school in August and expects to make things better at home.  06/10/22 appt noted: Doing OK overall.  No major changes.  Not purging as much but still  binging and mostly at night 7pm on. Taking Vyvanse about 10 AM.  No trouble falling asleep. No night eating.  Gets 8 hours of sleep.   If forgets Vyvanse then has binging purging day.  Would like to see this get better. No sig depression or anxiety. No SE  10/29/22 appt noted; Never able to get the Vyvanse BID until yesterdahy. Would like to do this for binge eating.  Does not bother her sleep.   Doing ok.  Mood and anxiety are doing ok.   No SE issues with meds. Still working on thyroid issues.  Normal BP.  Louis doing well now.    12/10/22 appt noted: Ongoing Vyvanse shortage.   Wants to switch to Adderall XR 30 mg BID Needs service dog for Louis and needs a letter for her to get the dog for Louis. Satisfied with sertraline   Past Psychiatric Medication Trials:  NAC capsules heartburn,  topiramate 100 Cog SE, sertraline 200,  naltrexone,  Vyvanse Adderall XR 25 didn't work as well as Vyvanse  Review of Systems:  Review of Systems  Cardiovascular:  Negative for palpitations.  Gastrointestinal:  Negative for constipation.  Psychiatric/Behavioral:  Negative for agitation, behavioral problems, confusion, decreased concentration, dysphoric mood, hallucinations, self-injury, sleep disturbance and suicidal ideas. The patient is not nervous/anxious and is not hyperactive.     Medications: I have reviewed the patient's current medications.  Current Outpatient Medications  Medication Sig Dispense Refill   levothyroxine (SYNTHROID) 112 MCG tablet Take 1 tablet (112 mcg total) by mouth daily. 30 tablet 3   loratadine (CLARITIN) 10 MG tablet Take 10 mg by mouth daily.     sertraline (ZOLOFT) 100 MG tablet Take 2 tablets (200 mg total) by mouth daily. 180 tablet 1   lisdexamfetamine (VYVANSE) 30 MG capsule Take 1 capsule (30 mg total) by mouth every morning. (Patient not taking: Reported on 12/10/2022) 30 capsule 0   lisdexamfetamine (VYVANSE) 40 MG capsule Take 1 capsule (40 mg total) by  mouth every morning. (Patient not taking: Reported on 12/10/2022) 90 capsule 0   phentermine 37.5 MG capsule Take 1 capsule (37.5 mg total) by mouth every morning. (Patient not taking: Reported on 12/10/2022) 30 capsule 0   rizatriptan (MAXALT) 10 MG tablet Take 1 tablet (10 mg total) by mouth daily as needed. May repeat in 2 hours if needed (Patient not taking: Reported on 12/10/2022) 10 tablet 2   rosuvastatin (CRESTOR) 10 MG tablet Take 1 tablet by mouth once daily (Patient not taking: Reported on 12/10/2022) 90 tablet 0   No current facility-administered medications for this visit.    Medication Side Effects: None  Allergies: No Known Allergies  Past Medical History:  Diagnosis Date   Migraine    Unspecified hypothyroidism     Family History  Problem Relation Age of Onset   Alcohol abuse Mother    Cancer Mother        lung   Heart disease Mother    Hypertension Mother    Hyperlipidemia Father  Heart disease Father    Hypertension Father    Diabetes Father    Cancer Other    Hypertension Other    Hyperlipidemia Other    Heart attack Other    Stroke Other     Social History   Socioeconomic History   Marital status: Married    Spouse name: Not on file   Number of children: 1   Years of education: Not on file   Highest education level: Not on file  Occupational History    Employer: UNEMPLOYED  Tobacco Use   Smoking status: Former   Smokeless tobacco: Never  Building services engineer Use: Never used  Substance and Sexual Activity   Alcohol use: Yes    Comment: occasionally wine   Drug use: No   Sexual activity: Not on file  Other Topics Concern   Not on file  Social History Narrative   Not on file   Social Determinants of Health   Financial Resource Strain: Not on file  Food Insecurity: Not on file  Transportation Needs: Not on file  Physical Activity: Not on file  Stress: Not on file  Social Connections: Not on file  Intimate Partner Violence: Not on file     Past Medical History, Surgical history, Social history, and Family history were reviewed and updated as appropriate.   Please see review of systems for further details on the patient's review from today.   Objective:   Physical Exam:  There were no vitals taken for this visit.  Physical Exam Constitutional:      General: She is not in acute distress.    Appearance: She is well-developed.  Musculoskeletal:        General: No deformity.  Neurological:     Mental Status: She is alert and oriented to person, place, and time.     Motor: No tremor.     Coordination: Coordination normal.     Gait: Gait normal.  Psychiatric:        Attention and Perception: Attention and perception normal.        Mood and Affect: Mood is not anxious or depressed. Affect is not labile or angry.        Speech: Speech normal.        Behavior: Behavior normal.        Thought Content: Thought content normal. Thought content is not delusional. Thought content does not include homicidal or suicidal ideation. Thought content does not include suicidal plan.        Cognition and Memory: Cognition normal.        Judgment: Judgment normal.     Comments: Insight intact. No auditory or visual hallucinations. No delusions.  Anxiety manageable. Minimal dep.      Lab Review:     Component Value Date/Time   NA 140 09/24/2022 0836   K 3.9 09/24/2022 0836   CL 105 09/24/2022 0836   CO2 30 09/24/2022 0836   GLUCOSE 84 09/24/2022 0836   BUN 15 09/24/2022 0836   CREATININE 0.83 09/24/2022 0836   CALCIUM 9.1 09/24/2022 0836   PROT 6.2 09/24/2022 0836   ALBUMIN 4.2 09/24/2022 0836   AST 18 09/24/2022 0836   ALT 13 09/24/2022 0836   ALKPHOS 37 (L) 09/24/2022 0836   BILITOT 0.5 09/24/2022 0836   GFRNONAA >60 01/10/2015 1910   GFRAA >60 01/10/2015 1910       Component Value Date/Time   WBC 4.0 01/21/2022 1000   RBC 4.69 01/21/2022 1000  HGB 13.9 01/21/2022 1000   HCT 42.2 01/21/2022 1000   PLT 213.0  01/21/2022 1000   MCV 90.0 01/21/2022 1000   MCH 30.8 01/10/2015 1910   MCHC 33.0 01/21/2022 1000   RDW 12.9 01/21/2022 1000   LYMPHSABS 1.4 01/21/2022 1000   MONOABS 0.3 01/21/2022 1000   EOSABS 0.2 01/21/2022 1000   BASOSABS 0.0 01/21/2022 1000    No results found for: "POCLITH", "LITHIUM"   No results found for: "PHENYTOIN", "PHENOBARB", "VALPROATE", "CBMZ"   .res Assessment: Plan:    Ariyah was seen today for follow-up.  Diagnoses and all orders for this visit:  Attention deficit hyperactivity disorder (ADHD), predominantly inattentive type -     Discontinue: amphetamine-dextroamphetamine (ADDERALL XR) 30 MG 24 hr capsule; Take 1 capsule (30 mg total) by mouth in the morning and at bedtime.  Binge eating disorder -     phentermine 37.5 MG capsule; Take 1 capsule (37.5 mg total) by mouth every morning. (Patient not taking: Reported on 12/10/2022)   Supportive therapy dealing with son disc in detail.  Disc him getting care.    Disc which  EDO would help.  The 2 best are Vyvanse and topiramate.  She had SE with topiramate.  Vyvanse does help her with the binge eating  and purging until the evening and then it gets worse.  Overall over the years less purging but still binging.    She has had counseling.   Can't get Vyvanse  Defer Adderall XR 30 BID Other option phentermine 37.5 mg AM.   She prefers this bc never took it before and Adderall less effective than Vyvanse.  contINUE sertraline 200  Anxiety is generally manageable with the sertraline it clearly helps. Mood is better.  FU 6 weeks.  Meredith Staggers, MD, DFAPA  Please see After Visit Summary for patient specific instructions.  Future Appointments  Date Time Provider Department Center  01/28/2023  3:30 PM Cottle, Steva Ready., MD [facility] None  05/01/2023  9:00 AM Cottle, Steva Ready., MD [facility] None     No orders of the defined types were placed in this encounter.      -------------------------------

## 2023-01-07 ENCOUNTER — Telehealth: Payer: Self-pay | Admitting: Psychiatry

## 2023-01-07 ENCOUNTER — Other Ambulatory Visit: Payer: Self-pay | Admitting: Psychiatry

## 2023-01-07 DIAGNOSIS — F5081 Binge eating disorder: Secondary | ICD-10-CM

## 2023-01-07 NOTE — Telephone Encounter (Signed)
Next visit is 01/28/23. Requesting refill on Phentermine 37.5 mg called to:  [facility] [address], [place] - [address]   Phone: [phone]  Fax: [phone]

## 2023-01-07 NOTE — Telephone Encounter (Signed)
Had received pharmacy RF request and pended.

## 2023-01-10 ENCOUNTER — Telehealth: Payer: Self-pay | Admitting: Psychiatry

## 2023-01-10 NOTE — Telephone Encounter (Signed)
Pt called at 9:53a to check on status of a letter for an emotional support animal.  She said she discussed it with Dr Jennelle Human at her visit on 6/18.  She asked for it to be uploaded to mychart, but to also call her to come pick up a copy when it's ready.  Next visit 8/6.

## 2023-01-10 NOTE — Telephone Encounter (Signed)
Please see message from patient. You had sent a MyChart note that you had emailed Deitra Mayo to ask what kind of letter she needed.

## 2023-01-14 ENCOUNTER — Telehealth: Payer: Self-pay | Admitting: Psychiatry

## 2023-01-14 NOTE — Telephone Encounter (Signed)
Pt called checking the staus of the letter. She needs it no later than July 29. Please call her when ready for pick up

## 2023-01-15 NOTE — Telephone Encounter (Signed)
Dr. Jennelle Human aware.

## 2023-01-28 ENCOUNTER — Ambulatory Visit: Payer: Commercial Managed Care - HMO | Admitting: Psychiatry

## 2023-01-28 ENCOUNTER — Encounter: Payer: Self-pay | Admitting: Psychiatry

## 2023-01-28 VITALS — BP 128/86 | HR 68

## 2023-01-28 DIAGNOSIS — F411 Generalized anxiety disorder: Secondary | ICD-10-CM

## 2023-01-28 DIAGNOSIS — F5081 Binge eating disorder: Secondary | ICD-10-CM | POA: Diagnosis not present

## 2023-01-28 DIAGNOSIS — F900 Attention-deficit hyperactivity disorder, predominantly inattentive type: Secondary | ICD-10-CM | POA: Diagnosis not present

## 2023-01-28 DIAGNOSIS — F5105 Insomnia due to other mental disorder: Secondary | ICD-10-CM

## 2023-01-28 MED ORDER — SERTRALINE HCL 100 MG PO TABS
200.0000 mg | ORAL_TABLET | Freq: Every day | ORAL | 1 refills | Status: DC
Start: 2023-01-28 — End: 2023-08-28

## 2023-01-28 MED ORDER — PHENTERMINE HCL 37.5 MG PO CAPS: 37.5000 mg | ORAL_CAPSULE | Freq: Every morning | ORAL | 3 refills | Status: DC

## 2023-01-28 NOTE — Progress Notes (Signed)
Katherine [name] [id number] August 23, 1975 47 y.o.  Subjective:   [name] is a 47 y.o. (DOB 1975/08/05) female.  Chief Complaint:  Chief Complaint  Patient presents with   Follow-up    Anxiety Patient reports no confusion, decreased concentration, nervous/anxious behavior, palpitations or suicidal ideas.    Depression        Associated symptoms include no decreased concentration and no suicidal ideas.  Past medical history includes anxiety.    Katherine [name] presents to the office today for follow-up of  EDO and mood. And stress.  seen in January 2021.  No meds were changed.  01/19/2020 appointment with the following noted: Mood has been OK.  Dog with cancer. Normal sadness. Tolerating meds except lower dose Topomax DT word-finding problems.  less struggles with some depression chronically.  Managing OK.  Can't go to gym which helped her mood.  Handling better than she did.   Sleep better now without meds. No concerns about the meds.  Vyvanse helps but wonders if she' more tolerant to it.  Can't take more topomax without cog problems.  Still binge eats.  Generally not depressed.  Function is okay.  Still gets anxious and stressed but has triggers for that.  Tolerates meds.  No change in binge eating pattern. Sleep affected by stress and don't want sleep meds. No new meds.  07/17/2020 appointment with following noted: Covid December through holidays.  Ed hospitalized briefly and recovered. Doing OK.  Katherine [name] at [employer] and going OK so far. Most of the time feels good.  Big transition having Katherine [name] come home and everyone sick. EDO not the best with holidays and stress and change in routine.  Would be great if there was something that could make it go away. Plan no changes  01/02/21 appt noted: About the same.  No local studies available for her to particpate in. No SE. Unless cog SE with topiramate over 50 mg daily. Patient reports stable mood and denies depressed  or irritable moods.  Patient denies any recent difficulty with anxiety.  Patient denies difficulty with sleep initiation or maintenance. Denies appetite disturbance.  Patient reports that energy and motivation have been good.  Patient denies any difficulty with concentration.  Patient denies any suicidal ideation. Still dealing with EDO. Katherine [name] is better but still loses control at times in public and at home.  His school went well. Plan: Disc potential off label naltrexone 25 mg - 50 mg q PM.  Disc SE and she agrees  07/05/2021 appointment with the following noted: Forgot naltrexone too much so not taking it. Still Vyvanse 60 and sertraline 200 mg daily. Has been fine over all with mood and anxiety. Still dealing with EDO issues. No Se with meds. Patient reports stable mood and denies depressed or irritable moods.  Patient denies any recent difficulty with anxiety.  Patient denies difficulty with sleep initiation or maintenance. Denies appetite disturbance.  Patient reports that energy and motivation have been good.  Patient denies any difficulty with concentration.  Patient denies any suicidal ideation. Still dealing with thyroid problems. Plan: Disc potential off label naltrexone 25 mg - 50 mg q PM.  Disc SE and she agrees  01/02/22 appt noted: OK overall but things at house are getting rough again. She doesn't think things are better but Ed diminishes that he does it. Katherine [name] can't regulate food intake won't stop eating until he throws up.  Needs antipsychotic.  Can't self regulate. Outside  of the house Katherine [name] is OK.  He needs constant stimulation.  He paces and can't settle down. Tried naltrexone a couple of times but not consistent with it.  No effects or SE noted.   She feels overall her meds ok.  She is not happy. Katherine [name] going to [employer] school in August and expects to make things better at home.  06/10/22 appt noted: Doing OK overall.  No major changes.  Not purging as much but still  binging and mostly at night 7pm on. Taking Vyvanse about 10 AM.  No trouble falling asleep. No night eating.  Gets 8 hours of sleep.   If forgets Vyvanse then has binging purging day.  Would like to see this get better. No sig depression or anxiety. No SE  10/29/22 appt noted; Never able to get the Vyvanse BID until yesterdahy. Would like to do this for binge eating.  Does not bother her sleep.   Doing ok.  Mood and anxiety are doing ok.   No SE issues with meds. Still working on thyroid issues.  Normal BP.  Katherine [name] doing well now.    12/10/22 appt noted: Ongoing Vyvanse shortage.   Wants to switch to Adderall XR 30 mg BID Needs service dog for Katherine [name] and needs a letter for her to get the dog for Katherine [name]. Satisfied with sertraline 200 Plan: instead of Vyvanse DT shortage phentermine 37.5 mg AM.   01/28/23 appt noted: Phentermine 37.5 mg AM and sertraline 200 As good benefit as was vyvanse 70 mg daily.  Duration is as good also.  Nights are the hardest times.   Anxiety is under control.  No sig dep.  Sleep good. Health is good   Past Psychiatric Medication Trials:  NAC capsules heartburn,  topiramate 100 Cog SE, sertraline 200,  naltrexone,  Vyvanse 70 Adderall XR 25 didn't work as well as Vyvanse  Review of Systems:  Review of Systems  Cardiovascular:  Negative for palpitations.  Gastrointestinal:  Negative for constipation.  Neurological:  Negative for tremors.  Psychiatric/Behavioral:  Negative for agitation, behavioral problems, confusion, decreased concentration, dysphoric mood, hallucinations, self-injury, sleep disturbance and suicidal ideas. The patient is not nervous/anxious and is not hyperactive.     Medications: I have reviewed the patient's current medications.  Current Outpatient Medications  Medication Sig Dispense Refill   levothyroxine (SYNTHROID) 112 MCG tablet Take 1 tablet (112 mcg total) by mouth daily. 30 tablet 3   loratadine (CLARITIN) 10 MG tablet Take 10  mg by mouth daily.     rizatriptan (MAXALT) 10 MG tablet Take 1 tablet (10 mg total) by mouth daily as needed. May repeat in 2 hours if needed 10 tablet 2   rosuvastatin (CRESTOR) 10 MG tablet Take 1 tablet by mouth once daily 90 tablet 0   lisdexamfetamine (VYVANSE) 30 MG capsule Take 1 capsule (30 mg total) by mouth every morning. (Patient not taking: Reported on 12/10/2022) 30 capsule 0   lisdexamfetamine (VYVANSE) 40 MG capsule Take 1 capsule (40 mg total) by mouth every morning. (Patient not taking: Reported on 12/10/2022) 90 capsule 0   phentermine 37.5 MG capsule Take 1 capsule (37.5 mg total) by mouth every morning. 30 capsule 3   sertraline (ZOLOFT) 100 MG tablet Take 2 tablets (200 mg total) by mouth daily. 180 tablet 1   No current facility-administered medications for this visit.    Medication Side Effects: None  Allergies: No Known Allergies  Past Medical History:  Diagnosis Date   Migraine  Unspecified hypothyroidism     Family History  Problem Relation Age of Onset   Alcohol abuse Mother    Cancer Mother        lung   Heart disease Mother    Hypertension Mother    Hyperlipidemia Father    Heart disease Father    Hypertension Father    Diabetes Father    Cancer Other    Hypertension Other    Hyperlipidemia Other    Heart attack Other    Stroke Other     Social History   Socioeconomic History   Marital status: Married    Spouse name: Not on file   Number of children: 1   Years of education: Not on file   Highest education level: Not on file  Occupational History    Employer: UNEMPLOYED  Tobacco Use   Smoking status: Former   Smokeless tobacco: Never  Vaping Use   Vaping status: Never Used  Substance and Sexual Activity   Alcohol use: Yes    Comment: occasionally wine   Drug use: No   Sexual activity: Not on file  Other Topics Concern   Not on file  Social History Narrative   Not on file   Social Determinants of Health   Financial Resource  Strain: Not on file  Food Insecurity: No Food Insecurity (12/04/2020)   [facility]   Hunger Vital Sign    Worried About Running Out of Food in the Last Year: Never true    Ran Out of Food in the Last Year: Never true  Transportation Needs: Not on file  Physical Activity: Not on file  Stress: Not on file  Social Connections: Unknown (11/04/2021)   [facility]   Social Network    Social Network: Not on file  Intimate Partner Violence: Unknown (09/26/2021)   Received from [facility]   HITS    Physically Hurt: Not on file    Insult or Talk Down To: Not on file    Threaten Physical Harm: Not on file    Scream or Curse: Not on file    Past Medical History, Surgical history, Social history, and Family history were reviewed and updated as appropriate.   Please see review of systems for further details on the patient's review from today.   Objective:   Physical Exam:  BP 128/86   Pulse 68   Physical Exam Constitutional:      General: She is not in acute distress.    Appearance: She is well-developed.  Musculoskeletal:        General: No deformity.  Neurological:     Mental Status: She is alert and oriented to person, place, and time.     Motor: No tremor.     Coordination: Coordination normal.     Gait: Gait normal.  Psychiatric:        Attention and Perception: Attention and perception normal.        Mood and Affect: Mood is not anxious or depressed. Affect is not labile or angry.        Speech: Speech normal.        Behavior: Behavior normal.        Thought Content: Thought content normal. Thought content is not delusional. Thought content does not include homicidal or suicidal ideation. Thought content does not include suicidal plan.        Cognition and Memory: Cognition normal.        Judgment: Judgment normal.  Comments: Insight intact. No auditory or visual hallucinations. No delusions.  Anxiety manageable.       Lab Review:      Component Value Date/Time   NA 140 09/24/2022 0836   K 3.9 09/24/2022 0836   CL 105 09/24/2022 0836   CO2 30 09/24/2022 0836   GLUCOSE 84 09/24/2022 0836   BUN 15 09/24/2022 0836   CREATININE 0.83 09/24/2022 0836   CALCIUM 9.1 09/24/2022 0836   PROT 6.2 09/24/2022 0836   ALBUMIN 4.2 09/24/2022 0836   AST 18 09/24/2022 0836   ALT 13 09/24/2022 0836   ALKPHOS 37 (L) 09/24/2022 0836   BILITOT 0.5 09/24/2022 0836   GFRNONAA >60 01/10/2015 1910   GFRAA >60 01/10/2015 1910       Component Value Date/Time   WBC 4.0 01/21/2022 1000   RBC 4.69 01/21/2022 1000   HGB 13.9 01/21/2022 1000   HCT 42.2 01/21/2022 1000   PLT 213.0 01/21/2022 1000   MCV 90.0 01/21/2022 1000   MCH 30.8 01/10/2015 1910   MCHC 33.0 01/21/2022 1000   RDW 12.9 01/21/2022 1000   LYMPHSABS 1.4 01/21/2022 1000   MONOABS 0.3 01/21/2022 1000   EOSABS 0.2 01/21/2022 1000   BASOSABS 0.0 01/21/2022 1000    No results found for: "POCLITH", "LITHIUM"   No results found for: "PHENYTOIN", "PHENOBARB", "VALPROATE", "CBMZ"   .res Assessment: Plan:    Aryanna was seen today for follow-up.  Diagnoses and all orders for this visit:  Generalized anxiety disorder -     sertraline (ZOLOFT) 100 MG tablet; Take 2 tablets (200 mg total) by mouth daily.  Attention deficit hyperactivity disorder (ADHD), predominantly inattentive type  Binge eating disorder -     phentermine 37.5 MG capsule; Take 1 capsule (37.5 mg total) by mouth every morning. -     sertraline (ZOLOFT) 100 MG tablet; Take 2 tablets (200 mg total) by mouth daily.  Insomnia due to mental condition    Supportive therapy dealing with son disc in detail.  Disc him getting care.    Disc which  EDO would help.  The 2 best are Vyvanse and topiramate.  She had SE with topiramate.  Vyvanse helped her with the binge eating  and purging until the evening and then it gets worse.  Overall over the years less purging but still binging.    She has had  counseling.   Can't get Vyvanse  Defer Adderall XR 30 BID Other option phentermine 37.5 mg AM.   She prefers this bc never took it before and Adderall less effective than Vyvanse. Discussed potential benefits, risks, and side effects of stimulants with patient to include increased heart rate, palpitations, insomnia, increased anxiety, increased irritability, or decreased appetite.  Instructed patient to contact office if experiencing any significant tolerability issues.  contINUE sertraline 200  Anxiety is generally manageable with the sertraline it clearly helps. Mood is better.  FU 6 mos Meredith Staggers, MD, DFAPA  Please see After Visit Summary for patient specific instructions.  Future Appointments  Date Time Provider Department Center  05/01/2023  9:00 AM Cottle, Steva Ready., MD [facility] None     No orders of the defined types were placed in this encounter.      -------------------------------

## 2023-01-31 ENCOUNTER — Other Ambulatory Visit: Payer: Self-pay | Admitting: Family Medicine

## 2023-01-31 ENCOUNTER — Other Ambulatory Visit: Payer: Self-pay

## 2023-01-31 DIAGNOSIS — E782 Mixed hyperlipidemia: Secondary | ICD-10-CM

## 2023-01-31 MED ORDER — ROSUVASTATIN CALCIUM 10 MG PO TABS
10.0000 mg | ORAL_TABLET | Freq: Every day | ORAL | 0 refills | Status: DC
Start: 2023-01-31 — End: 2023-09-01

## 2023-01-31 NOTE — Telephone Encounter (Signed)
Last refill 11/04/2022 Last office visit 09/24/2022

## 2023-02-11 ENCOUNTER — Other Ambulatory Visit: Payer: Self-pay

## 2023-02-11 DIAGNOSIS — G43909 Migraine, unspecified, not intractable, without status migrainosus: Secondary | ICD-10-CM

## 2023-02-11 MED ORDER — RIZATRIPTAN BENZOATE 10 MG PO TABS
10.0000 mg | ORAL_TABLET | Freq: Every day | ORAL | 2 refills | Status: DC | PRN
Start: 2023-02-11 — End: 2023-06-04

## 2023-02-26 ENCOUNTER — Ambulatory Visit (INDEPENDENT_AMBULATORY_CARE_PROVIDER_SITE_OTHER): Payer: Commercial Managed Care - HMO | Admitting: Family Medicine

## 2023-02-26 ENCOUNTER — Encounter: Payer: Self-pay | Admitting: Family Medicine

## 2023-02-26 VITALS — BP 114/72 | HR 75 | Temp 97.9°F | Resp 18 | Ht 64.0 in | Wt 132.0 lb

## 2023-02-26 DIAGNOSIS — Z23 Encounter for immunization: Secondary | ICD-10-CM

## 2023-02-26 DIAGNOSIS — Z0000 Encounter for general adult medical examination without abnormal findings: Secondary | ICD-10-CM

## 2023-02-26 DIAGNOSIS — Z1211 Encounter for screening for malignant neoplasm of colon: Secondary | ICD-10-CM

## 2023-02-26 DIAGNOSIS — E039 Hypothyroidism, unspecified: Secondary | ICD-10-CM

## 2023-02-26 DIAGNOSIS — E7800 Pure hypercholesterolemia, unspecified: Secondary | ICD-10-CM | POA: Diagnosis not present

## 2023-02-26 LAB — HEPATIC FUNCTION PANEL
ALT: 14 U/L (ref 0–35)
AST: 22 U/L (ref 0–37)
Albumin: 3.9 g/dL (ref 3.5–5.2)
Alkaline Phosphatase: 39 U/L (ref 39–117)
Bilirubin, Direct: 0.1 mg/dL (ref 0.0–0.3)
Total Bilirubin: 0.5 mg/dL (ref 0.2–1.2)
Total Protein: 6.4 g/dL (ref 6.0–8.3)

## 2023-02-26 LAB — CBC WITH DIFFERENTIAL/PLATELET
Basophils Absolute: 0 10*3/uL (ref 0.0–0.1)
Basophils Relative: 0.7 % (ref 0.0–3.0)
Eosinophils Absolute: 0.1 10*3/uL (ref 0.0–0.7)
Eosinophils Relative: 3.5 % (ref 0.0–5.0)
HCT: 38.6 % (ref 36.0–46.0)
Hemoglobin: 12.8 g/dL (ref 12.0–15.0)
Lymphocytes Relative: 30.8 % (ref 12.0–46.0)
Lymphs Abs: 1.2 10*3/uL (ref 0.7–4.0)
MCHC: 33.2 g/dL (ref 30.0–36.0)
MCV: 91.3 fl (ref 78.0–100.0)
Monocytes Absolute: 0.3 10*3/uL (ref 0.1–1.0)
Monocytes Relative: 8.7 % (ref 3.0–12.0)
Neutro Abs: 2.1 10*3/uL (ref 1.4–7.7)
Neutrophils Relative %: 56.3 % (ref 43.0–77.0)
Platelets: 219 10*3/uL (ref 150.0–400.0)
RBC: 4.23 Mil/uL (ref 3.87–5.11)
RDW: 12.8 % (ref 11.5–15.5)
WBC: 3.8 10*3/uL — ABNORMAL LOW (ref 4.0–10.5)

## 2023-02-26 LAB — LIPID PANEL
Cholesterol: 183 mg/dL (ref 0–200)
HDL: 84.4 mg/dL (ref >39.00)
LDL Cholesterol: 82 mg/dL (ref 0–99)
NonHDL: 98.85
Total CHOL/HDL Ratio: 2
Triglycerides: 85 mg/dL (ref 0.0–149.0)
VLDL: 17 mg/dL (ref 0.0–40.0)

## 2023-02-26 LAB — BASIC METABOLIC PANEL
BUN: 14 mg/dL (ref 6–23)
CO2: 27 meq/L (ref 19–32)
Calcium: 8.8 mg/dL (ref 8.4–10.5)
Chloride: 106 meq/L (ref 96–112)
Creatinine, Ser: 0.83 mg/dL (ref 0.40–1.20)
GFR: 82.64 mL/min (ref >60.00)
Glucose, Bld: 70 mg/dL (ref 70–99)
Potassium: 3.8 meq/L (ref 3.5–5.1)
Sodium: 140 meq/L (ref 135–145)

## 2023-02-26 LAB — TSH: TSH: 0.14 u[IU]/mL — ABNORMAL LOW (ref 0.35–5.50)

## 2023-02-26 NOTE — Progress Notes (Signed)
   Subjective:    Patient ID: Katherine [name], female    DOB: 07/09/1975, 47 y.o.   MRN: [id number]  HPI CPE- due for [name], mammo, colonoscopy.  UTD on Tdap.  Will get flu.  Pt prefers cologuard.  Will call [facility] to schedule  Patient Care Team    Relationship Specialty Notifications Start End  Sheliah Hatch, MD PCP - General Family Medicine  10/07/13   Freddy Finner, MD (Inactive) Consulting Physician Obstetrics and Gynecology  06/02/15      Health Maintenance  Topic Date Due   Colonoscopy  Never done   MAMMOGRAM  09/21/2018   [name] SMEAR-Modifier  09/20/2020   INFLUENZA VACCINE  01/23/2023   DTaP/Tdap/Td (3 - Td or Tdap) 11/21/2027   HPV VACCINES  Aged Out   COVID-19 Vaccine  Discontinued   Hepatitis C Screening  Discontinued   HIV Screening  Discontinued      Review of Systems Patient reports no vision/ hearing changes, adenopathy,fever, weight change,  persistant/recurrent hoarseness , swallowing issues, chest pain, palpitations, edema, persistant/recurrent cough, hemoptysis, dyspnea (rest/exertional/paroxysmal nocturnal), gastrointestinal bleeding (melena, rectal bleeding), abdominal pain, significant heartburn, bowel changes, GU symptoms (dysuria, hematuria, incontinence), Gyn symptoms (abnormal  bleeding, pain),  syncope, focal weakness, memory loss, numbness & tingling, skin/hair/nail changes, abnormal bruising or bleeding, anxiety, or depression.     Objective:   Physical Exam General Appearance:    Alert, cooperative, no distress, appears stated age  Head:    Normocephalic, without obvious abnormality, atraumatic  Eyes:    PERRL, conjunctiva/corneas clear, EOM's intact both eyes  Ears:    Normal TM's and external ear canals, both ears  Nose:   Nares normal, septum midline, mucosa normal, no drainage    or sinus tenderness  Throat:   Lips, mucosa, and tongue normal; teeth and gums normal  Neck:   Supple, symmetrical, trachea midline, no adenopathy;     Thyroid: no enlargement/tenderness/nodules  Back:     Symmetric, no curvature, ROM normal, no CVA tenderness  Lungs:     Clear to auscultation bilaterally, respirations unlabored  Chest Wall:    No tenderness or deformity   Heart:    Regular rate and rhythm, S1 and S2 normal, no murmur, rub   or gallop  Breast Exam:    Deferred to GYN  Abdomen:     Soft, non-tender, bowel sounds active all four quadrants,    no masses, no organomegaly  Genitalia:    Deferred to GYN  Rectal:    Extremities:   Extremities normal, atraumatic, no cyanosis or edema  Pulses:   2+ and symmetric all extremities  Skin:   Skin color, texture, turgor normal, no rashes or lesions  Lymph nodes:   Cervical, supraclavicular, and axillary nodes normal  Neurologic:   CNII-XII intact, normal strength, sensation and reflexes    throughout          Assessment & Plan:

## 2023-02-26 NOTE — Patient Instructions (Signed)
Follow up in 1 year or as needed We'll notify you of your lab results and make any changes if needed Keep up the good work on healthy diet and regular exercise- you look great! Complete and return the cologuard as directed Call and schedule your pap and mammo Call with any questions or concerns Stay Safe!  Stay Healthy! Happy Fall!!

## 2023-02-26 NOTE — Assessment & Plan Note (Signed)
Pt's PE WNL.  UTD on Tdap.  Flu shot given.  Cologuard ordered.  Pt to call GYN and schedule pap and mammo.  Check labs.  Anticipatory guidance provided.

## 2023-02-27 ENCOUNTER — Telehealth: Payer: Self-pay

## 2023-02-27 ENCOUNTER — Telehealth: Payer: Self-pay | Admitting: Psychiatry

## 2023-02-27 ENCOUNTER — Other Ambulatory Visit: Payer: Self-pay

## 2023-02-27 DIAGNOSIS — E039 Hypothyroidism, unspecified: Secondary | ICD-10-CM

## 2023-02-27 MED ORDER — LEVOTHYROXINE SODIUM 100 MCG PO TABS
100.0000 ug | ORAL_TABLET | Freq: Every day | ORAL | 3 refills | Status: DC
Start: 2023-02-27 — End: 2023-06-30

## 2023-02-27 NOTE — Telephone Encounter (Signed)
-----   Message from Neena Rhymes sent at 02/27/2023  7:22 AM EDT ----- Labs look great w/ exception of slightly low TSH.  This means your levothyroxine dose is too high.  We will decrease to daily (#30, 3 refills) and repeat your TSH at a lab only visit in 1 month (dx hypothyroid)

## 2023-02-27 NOTE — Telephone Encounter (Signed)
Rx was sent to [facility], not [facility]. She said it should have gone to [facility]. Called and they are filing today, told her she had no RF left. She has a new Rx so she may have been trying to fill with an old Rx. Patient notified.

## 2023-02-27 NOTE — Telephone Encounter (Signed)
Pt LVM @ 9:31a.  She said she called earlier in the week for a refill on the Phentermine to [facility].  I told her it shows she had 3 refills, but pls call to see what the issue is as she said the pharmacy told her to call us.  Next appt 11/7

## 2023-02-27 NOTE — Telephone Encounter (Signed)
Pt is aware of lab results and TSH order is in levothyroxine 100 mcg has ben sent in. Lab only visit has been schedule

## 2023-03-13 ENCOUNTER — Telehealth: Payer: Self-pay

## 2023-03-13 LAB — COLOGUARD: COLOGUARD: NEGATIVE

## 2023-03-13 NOTE — Telephone Encounter (Signed)
-----   Message from Neena Rhymes sent at 03/13/2023  8:12 AM EDT ----- Normal cologuard- great news!

## 2023-03-13 NOTE — Progress Notes (Signed)
Left pt VM

## 2023-03-28 ENCOUNTER — Other Ambulatory Visit: Payer: Managed Care, Other (non HMO)

## 2023-03-31 ENCOUNTER — Telehealth: Payer: Self-pay

## 2023-03-31 ENCOUNTER — Other Ambulatory Visit (INDEPENDENT_AMBULATORY_CARE_PROVIDER_SITE_OTHER): Payer: Managed Care, Other (non HMO)

## 2023-03-31 DIAGNOSIS — E039 Hypothyroidism, unspecified: Secondary | ICD-10-CM

## 2023-03-31 LAB — TSH: TSH: 1.36 u[IU]/mL (ref 0.35–5.50)

## 2023-03-31 NOTE — Telephone Encounter (Signed)
TSH is now normal.  No changes at this time

## 2023-04-01 NOTE — Telephone Encounter (Signed)
Called patient to discuss lab work, no answer, left a message for the patient to call back to discuss or review by MyChart if that is prefered.

## 2023-04-02 NOTE — Telephone Encounter (Signed)
Discussed with patient, no questions or concerns at this time

## 2023-05-01 ENCOUNTER — Ambulatory Visit: Payer: Self-pay | Admitting: Psychiatry

## 2023-05-12 ENCOUNTER — Encounter: Payer: Self-pay | Admitting: Family Medicine

## 2023-05-12 DIAGNOSIS — E039 Hypothyroidism, unspecified: Secondary | ICD-10-CM

## 2023-06-04 ENCOUNTER — Other Ambulatory Visit: Payer: Self-pay | Admitting: Family Medicine

## 2023-06-04 DIAGNOSIS — G43909 Migraine, unspecified, not intractable, without status migrainosus: Secondary | ICD-10-CM

## 2023-06-05 NOTE — Addendum Note (Signed)
Addended by: Sheliah Hatch on: 06/05/2023 09:56 AM   Modules accepted: Orders

## 2023-06-29 ENCOUNTER — Other Ambulatory Visit: Payer: Self-pay | Admitting: Family Medicine

## 2023-06-29 DIAGNOSIS — E039 Hypothyroidism, unspecified: Secondary | ICD-10-CM

## 2023-07-14 ENCOUNTER — Encounter: Payer: Self-pay | Admitting: Family Medicine

## 2023-07-14 DIAGNOSIS — G43909 Migraine, unspecified, not intractable, without status migrainosus: Secondary | ICD-10-CM

## 2023-07-14 MED ORDER — RIZATRIPTAN BENZOATE 10 MG PO TABS
ORAL_TABLET | ORAL | 6 refills | Status: AC
Start: 2023-07-14 — End: ?

## 2023-07-14 MED ORDER — BETAMETHASONE DIPROPIONATE 0.05 % EX LOTN: TOPICAL_LOTION | Freq: Two times a day (BID) | CUTANEOUS | 1 refills | Status: DC

## 2023-07-14 NOTE — Addendum Note (Signed)
Addended by: Sheliah Hatch on: 07/14/2023 03:43 PM   Modules accepted: Orders

## 2023-07-31 ENCOUNTER — Ambulatory Visit: Payer: Commercial Managed Care - HMO | Admitting: Psychiatry

## 2023-08-07 ENCOUNTER — Other Ambulatory Visit: Payer: Self-pay

## 2023-08-07 ENCOUNTER — Telehealth: Payer: Self-pay | Admitting: Psychiatry

## 2023-08-07 DIAGNOSIS — F50819 Binge eating disorder, unspecified: Secondary | ICD-10-CM

## 2023-08-07 MED ORDER — PHENTERMINE HCL 37.5 MG PO CAPS: 37.5000 mg | ORAL_CAPSULE | Freq: Every morning | ORAL | 1 refills | Status: DC

## 2023-08-07 NOTE — Telephone Encounter (Signed)
Pt needs a RF on Phentermine. Please send to [facility] ,[address] [phone]. This will be her new pharmacy from now on.

## 2023-08-07 NOTE — Telephone Encounter (Signed)
Pended Phentermine 37.5 mg to [facility].

## 2023-08-28 ENCOUNTER — Other Ambulatory Visit: Payer: Self-pay | Admitting: Psychiatry

## 2023-08-28 DIAGNOSIS — F411 Generalized anxiety disorder: Secondary | ICD-10-CM

## 2023-08-28 DIAGNOSIS — F50819 Binge eating disorder, unspecified: Secondary | ICD-10-CM

## 2023-08-28 MED ORDER — SERTRALINE HCL 100 MG PO TABS: 200.0000 mg | ORAL_TABLET | Freq: Every day | ORAL | 1 refills | Status: DC

## 2023-08-28 MED ORDER — PHENTERMINE HCL 37.5 MG PO CAPS
37.5000 mg | ORAL_CAPSULE | Freq: Every morning | ORAL | 3 refills | Status: AC
Start: 2023-08-28 — End: ?

## 2023-08-30 ENCOUNTER — Encounter: Payer: Self-pay | Admitting: Family Medicine

## 2023-08-30 DIAGNOSIS — E039 Hypothyroidism, unspecified: Secondary | ICD-10-CM

## 2023-08-30 DIAGNOSIS — E782 Mixed hyperlipidemia: Secondary | ICD-10-CM

## 2023-09-01 MED ORDER — ROSUVASTATIN CALCIUM 10 MG PO TABS: 10.0000 mg | ORAL_TABLET | Freq: Every day | ORAL | 0 refills | Status: DC

## 2023-09-01 MED ORDER — BETAMETHASONE DIPROPIONATE 0.05 % EX LOTN: TOPICAL_LOTION | Freq: Two times a day (BID) | CUTANEOUS | 1 refills | Status: AC

## 2023-09-01 MED ORDER — LEVOTHYROXINE SODIUM 112 MCG PO TABS: 112.0000 ug | ORAL_TABLET | Freq: Every day | ORAL | 3 refills | Status: AC

## 2023-09-25 ENCOUNTER — Ambulatory Visit: Payer: Commercial Managed Care - HMO | Admitting: Psychiatry

## 2023-11-27 ENCOUNTER — Other Ambulatory Visit: Payer: Self-pay | Admitting: Family Medicine

## 2023-11-27 DIAGNOSIS — E782 Mixed hyperlipidemia: Secondary | ICD-10-CM

## 2024-01-19 ENCOUNTER — Other Ambulatory Visit: Payer: Self-pay | Admitting: Family Medicine

## 2024-01-19 ENCOUNTER — Other Ambulatory Visit: Payer: Self-pay | Admitting: Psychiatry

## 2024-01-19 DIAGNOSIS — E782 Mixed hyperlipidemia: Secondary | ICD-10-CM

## 2024-01-19 DIAGNOSIS — F411 Generalized anxiety disorder: Secondary | ICD-10-CM

## 2024-01-19 DIAGNOSIS — F50819 Binge eating disorder, unspecified: Secondary | ICD-10-CM

## 2024-01-26 ENCOUNTER — Other Ambulatory Visit (HOSPITAL_COMMUNITY): Payer: Self-pay

## 2024-01-26 MED ORDER — RIZATRIPTAN BENZOATE 10 MG PO TABS
10.0000 mg | ORAL_TABLET | Freq: Every day | ORAL | 6 refills | Status: DC | PRN
  Filled 2024-01-26: qty 12, 27d supply, fill #0
  Filled 2024-01-27: qty 12, 30d supply, fill #0

## 2024-01-26 MED ORDER — ROSUVASTATIN CALCIUM 10 MG PO TABS
10.0000 mg | ORAL_TABLET | Freq: Every day | ORAL | 0 refills | Status: AC
  Filled 2024-01-26 – 2024-03-15 (×4): qty 90, 90d supply, fill #0

## 2024-01-26 MED ORDER — SERTRALINE HCL 100 MG PO TABS
200.0000 mg | ORAL_TABLET | Freq: Every day | ORAL | 0 refills | Status: DC
  Filled 2024-01-26 – 2024-03-15 (×4): qty 180, 90d supply, fill #0

## 2024-01-26 MED ORDER — LEVOTHYROXINE SODIUM 100 MCG PO TABS
100.0000 ug | ORAL_TABLET | Freq: Every morning | ORAL | 3 refills | Status: AC
  Filled 2024-01-26 – 2024-03-15 (×2): qty 90, 90d supply, fill #0
  Filled 2024-06-08: qty 90, 90d supply, fill #1

## 2024-01-26 MED ORDER — NORETHINDRONE-ETH ESTRADIOL 1-5 MG-MCG PO TABS
1.0000 | ORAL_TABLET | Freq: Every day | ORAL | 5 refills | Status: DC
  Filled 2024-01-26: qty 28, 28d supply, fill #0
  Filled 2024-02-26 – 2024-03-15 (×2): qty 28, 28d supply, fill #1

## 2024-01-27 ENCOUNTER — Other Ambulatory Visit (HOSPITAL_COMMUNITY): Payer: Self-pay

## 2024-01-28 ENCOUNTER — Other Ambulatory Visit (HOSPITAL_COMMUNITY): Payer: Self-pay

## 2024-01-28 ENCOUNTER — Other Ambulatory Visit: Payer: Self-pay

## 2024-01-28 MED ORDER — TIZANIDINE HCL 4 MG PO TABS
8.0000 mg | ORAL_TABLET | Freq: Three times a day (TID) | ORAL | 0 refills | Status: AC | PRN
  Filled 2024-01-28: qty 15, 3d supply, fill #0

## 2024-01-28 MED ORDER — IBUPROFEN 800 MG PO TABS
800.0000 mg | ORAL_TABLET | Freq: Three times a day (TID) | ORAL | 0 refills | Status: AC | PRN
  Filled 2024-01-28: qty 30, 10d supply, fill #0

## 2024-01-29 ENCOUNTER — Other Ambulatory Visit (HOSPITAL_COMMUNITY): Payer: Self-pay

## 2024-01-29 MED ORDER — PHENTERMINE HCL 37.5 MG PO CAPS
37.5000 mg | ORAL_CAPSULE | Freq: Every morning | ORAL | 2 refills | Status: DC
  Filled 2024-01-29: qty 30, 30d supply, fill #0
  Filled 2024-03-15: qty 30, 30d supply, fill #1
  Filled 2024-04-14 (×2): qty 30, 30d supply, fill #2

## 2024-02-26 ENCOUNTER — Other Ambulatory Visit (HOSPITAL_COMMUNITY): Payer: Self-pay

## 2024-02-26 ENCOUNTER — Other Ambulatory Visit: Payer: Self-pay

## 2024-02-26 ENCOUNTER — Other Ambulatory Visit: Payer: Self-pay | Admitting: Family Medicine

## 2024-02-26 MED ORDER — RIZATRIPTAN BENZOATE 10 MG PO TABS
10.0000 mg | ORAL_TABLET | Freq: Every day | ORAL | 6 refills | Status: AC | PRN
  Filled 2024-02-26: qty 12, 20d supply, fill #0
  Filled 2024-03-15: qty 12, 30d supply, fill #0
  Filled 2024-04-14 (×2): qty 12, 30d supply, fill #1
  Filled 2024-05-15 – 2024-05-17 (×2): qty 12, 30d supply, fill #2
  Filled 2024-07-02: qty 12, 30d supply, fill #3

## 2024-02-27 ENCOUNTER — Other Ambulatory Visit: Payer: Self-pay

## 2024-02-27 ENCOUNTER — Encounter: Payer: Self-pay | Admitting: Pharmacist

## 2024-02-27 ENCOUNTER — Encounter: Payer: Commercial Managed Care - HMO | Admitting: Family Medicine

## 2024-03-03 ENCOUNTER — Other Ambulatory Visit (HOSPITAL_COMMUNITY): Payer: Self-pay

## 2024-03-03 ENCOUNTER — Other Ambulatory Visit: Payer: Self-pay

## 2024-03-15 ENCOUNTER — Other Ambulatory Visit: Payer: Self-pay

## 2024-03-15 ENCOUNTER — Other Ambulatory Visit (HOSPITAL_COMMUNITY): Payer: Self-pay

## 2024-04-07 ENCOUNTER — Other Ambulatory Visit (HOSPITAL_COMMUNITY): Payer: Self-pay

## 2024-04-07 MED ORDER — NORETHINDRONE-ETH ESTRADIOL 1-5 MG-MCG PO TABS
1.0000 | ORAL_TABLET | Freq: Every day | ORAL | 5 refills | Status: AC
  Filled 2024-04-07: qty 28, 28d supply, fill #0
  Filled 2024-05-05: qty 28, 28d supply, fill #1
  Filled 2024-06-02: qty 28, 28d supply, fill #2
  Filled 2024-06-30: qty 28, 28d supply, fill #3
  Filled 2024-07-29: qty 28, 28d supply, fill #4

## 2024-04-14 ENCOUNTER — Other Ambulatory Visit (HOSPITAL_COMMUNITY): Payer: Self-pay

## 2024-05-17 ENCOUNTER — Other Ambulatory Visit (HOSPITAL_COMMUNITY): Payer: Self-pay

## 2024-06-03 ENCOUNTER — Other Ambulatory Visit (HOSPITAL_COMMUNITY): Payer: Self-pay

## 2024-06-03 ENCOUNTER — Other Ambulatory Visit: Payer: Self-pay

## 2024-06-03 MED ORDER — PHENTERMINE HCL 37.5 MG PO CAPS
37.5000 mg | ORAL_CAPSULE | Freq: Every morning | ORAL | 2 refills | Status: AC
  Filled 2024-06-03: qty 30, 30d supply, fill #0
  Filled 2024-07-27: qty 30, 30d supply, fill #1

## 2024-06-08 ENCOUNTER — Other Ambulatory Visit: Payer: Self-pay | Admitting: Family Medicine

## 2024-06-08 ENCOUNTER — Other Ambulatory Visit: Payer: Self-pay | Admitting: Psychiatry

## 2024-06-08 ENCOUNTER — Other Ambulatory Visit (HOSPITAL_COMMUNITY): Payer: Self-pay

## 2024-06-08 ENCOUNTER — Other Ambulatory Visit: Payer: Self-pay

## 2024-06-08 MED ORDER — SERTRALINE HCL 100 MG PO TABS
200.0000 mg | ORAL_TABLET | Freq: Every day | ORAL | 0 refills | Status: AC
  Filled 2024-06-08 – 2024-07-02 (×2): qty 180, 90d supply, fill #0

## 2024-06-09 ENCOUNTER — Other Ambulatory Visit (HOSPITAL_COMMUNITY): Payer: Self-pay

## 2024-06-10 ENCOUNTER — Other Ambulatory Visit (HOSPITAL_COMMUNITY): Payer: Self-pay

## 2024-06-18 ENCOUNTER — Other Ambulatory Visit (HOSPITAL_COMMUNITY): Payer: Self-pay

## 2024-07-02 ENCOUNTER — Other Ambulatory Visit: Payer: Self-pay

## 2024-07-29 ENCOUNTER — Other Ambulatory Visit: Payer: Self-pay

## 2024-07-29 ENCOUNTER — Other Ambulatory Visit (HOSPITAL_COMMUNITY): Payer: Self-pay

## 2024-07-30 ENCOUNTER — Other Ambulatory Visit (HOSPITAL_COMMUNITY): Payer: Self-pay
